# Patient Record
Sex: Female | Born: 1945
Health system: Southern US, Community
[De-identification: ages and names within clinical notes are randomized; demographics above are authoritative.]

## PROBLEM LIST (undated history)

## (undated) DIAGNOSIS — T7840XA Allergy, unspecified, initial encounter: Secondary | ICD-10-CM

## (undated) DIAGNOSIS — I8390 Asymptomatic varicose veins of unspecified lower extremity: Secondary | ICD-10-CM

## (undated) DIAGNOSIS — D649 Anemia, unspecified: Secondary | ICD-10-CM

## (undated) DIAGNOSIS — M542 Cervicalgia: Secondary | ICD-10-CM

## (undated) DIAGNOSIS — H269 Unspecified cataract: Secondary | ICD-10-CM

## (undated) DIAGNOSIS — R112 Nausea with vomiting, unspecified: Secondary | ICD-10-CM

## (undated) DIAGNOSIS — I10 Essential (primary) hypertension: Secondary | ICD-10-CM

## (undated) DIAGNOSIS — R519 Headache, unspecified: Secondary | ICD-10-CM

## (undated) DIAGNOSIS — E785 Hyperlipidemia, unspecified: Secondary | ICD-10-CM

## (undated) DIAGNOSIS — Z5189 Encounter for other specified aftercare: Secondary | ICD-10-CM

## (undated) DIAGNOSIS — R51 Headache: Secondary | ICD-10-CM

## (undated) DIAGNOSIS — J349 Unspecified disorder of nose and nasal sinuses: Secondary | ICD-10-CM

## (undated) DIAGNOSIS — Z9889 Other specified postprocedural states: Secondary | ICD-10-CM

## (undated) HISTORY — DX: Allergy, unspecified, initial encounter: T78.40XA

## (undated) HISTORY — DX: Unspecified disorder of nose and nasal sinuses: J34.9

## (undated) HISTORY — PX: COLONOSCOPY: SHX174

## (undated) HISTORY — DX: Asymptomatic varicose veins of unspecified lower extremity: I83.90

## (undated) HISTORY — DX: Hyperlipidemia, unspecified: E78.5

## (undated) HISTORY — PX: UPPER GASTROINTESTINAL ENDOSCOPY: SHX188

## (undated) HISTORY — DX: Anemia, unspecified: D64.9

## (undated) HISTORY — PX: ROTATOR CUFF REPAIR: SHX139

## (undated) HISTORY — DX: Encounter for other specified aftercare: Z51.89

## (undated) HISTORY — DX: Essential (primary) hypertension: I10

## (undated) HISTORY — PX: ABDOMINAL HYSTERECTOMY: SHX81

## (undated) HISTORY — DX: Unspecified cataract: H26.9

## (undated) HISTORY — DX: Cervicalgia: M54.2

## (undated) HISTORY — DX: Headache: R51

## (undated) HISTORY — DX: Headache, unspecified: R51.9

---

## 1998-05-02 ENCOUNTER — Encounter: Payer: Self-pay | Admitting: Internal Medicine

## 1998-05-02 ENCOUNTER — Ambulatory Visit (HOSPITAL_COMMUNITY): Admission: RE | Admit: 1998-05-02 | Discharge: 1998-05-02 | Payer: Self-pay | Admitting: Internal Medicine

## 1999-02-02 ENCOUNTER — Other Ambulatory Visit: Admission: RE | Admit: 1999-02-02 | Discharge: 1999-02-02 | Payer: Self-pay | Admitting: Internal Medicine

## 1999-03-19 HISTORY — PX: BREAST REDUCTION SURGERY: SHX8

## 1999-05-07 ENCOUNTER — Ambulatory Visit (HOSPITAL_COMMUNITY): Admission: RE | Admit: 1999-05-07 | Discharge: 1999-05-07 | Payer: Self-pay | Admitting: Internal Medicine

## 1999-05-07 ENCOUNTER — Encounter: Payer: Self-pay | Admitting: Internal Medicine

## 2000-02-11 ENCOUNTER — Encounter (INDEPENDENT_AMBULATORY_CARE_PROVIDER_SITE_OTHER): Payer: Self-pay

## 2000-02-11 ENCOUNTER — Other Ambulatory Visit: Admission: RE | Admit: 2000-02-11 | Discharge: 2000-02-11 | Payer: Self-pay | Admitting: Plastic Surgery

## 2000-02-20 ENCOUNTER — Other Ambulatory Visit: Admission: RE | Admit: 2000-02-20 | Discharge: 2000-02-20 | Payer: Self-pay | Admitting: Internal Medicine

## 2000-03-18 HISTORY — PX: REDUCTION MAMMAPLASTY: SUR839

## 2000-10-13 ENCOUNTER — Ambulatory Visit (HOSPITAL_COMMUNITY): Admission: RE | Admit: 2000-10-13 | Discharge: 2000-10-13 | Payer: Self-pay | Admitting: Internal Medicine

## 2000-10-13 ENCOUNTER — Encounter: Payer: Self-pay | Admitting: Internal Medicine

## 2001-12-04 ENCOUNTER — Ambulatory Visit (HOSPITAL_COMMUNITY): Admission: RE | Admit: 2001-12-04 | Discharge: 2001-12-04 | Payer: Self-pay | Admitting: Internal Medicine

## 2003-09-27 ENCOUNTER — Ambulatory Visit (HOSPITAL_COMMUNITY): Admission: RE | Admit: 2003-09-27 | Discharge: 2003-09-27 | Payer: Self-pay | Admitting: Internal Medicine

## 2004-09-27 ENCOUNTER — Ambulatory Visit (HOSPITAL_COMMUNITY): Admission: RE | Admit: 2004-09-27 | Discharge: 2004-09-27 | Payer: Self-pay | Admitting: Internal Medicine

## 2005-03-27 ENCOUNTER — Encounter: Admission: RE | Admit: 2005-03-27 | Discharge: 2005-03-27 | Payer: Self-pay | Admitting: Orthopaedic Surgery

## 2005-04-10 ENCOUNTER — Encounter: Admission: RE | Admit: 2005-04-10 | Discharge: 2005-04-10 | Payer: Self-pay | Admitting: Orthopaedic Surgery

## 2005-10-25 ENCOUNTER — Ambulatory Visit (HOSPITAL_COMMUNITY): Admission: RE | Admit: 2005-10-25 | Discharge: 2005-10-25 | Payer: Self-pay | Admitting: Internal Medicine

## 2005-12-18 ENCOUNTER — Encounter: Admission: RE | Admit: 2005-12-18 | Discharge: 2006-01-09 | Payer: Self-pay | Admitting: Internal Medicine

## 2006-12-02 ENCOUNTER — Encounter: Admission: RE | Admit: 2006-12-02 | Discharge: 2006-12-02 | Payer: Self-pay | Admitting: Internal Medicine

## 2007-12-08 ENCOUNTER — Encounter: Admission: RE | Admit: 2007-12-08 | Discharge: 2007-12-08 | Payer: Self-pay | Admitting: Internal Medicine

## 2007-12-30 ENCOUNTER — Ambulatory Visit: Payer: Self-pay | Admitting: Internal Medicine

## 2008-01-11 ENCOUNTER — Ambulatory Visit (HOSPITAL_COMMUNITY): Admission: RE | Admit: 2008-01-11 | Discharge: 2008-01-11 | Payer: Self-pay | Admitting: Internal Medicine

## 2008-01-11 ENCOUNTER — Ambulatory Visit: Payer: Self-pay | Admitting: Internal Medicine

## 2008-12-19 ENCOUNTER — Encounter: Admission: RE | Admit: 2008-12-19 | Discharge: 2008-12-19 | Payer: Self-pay | Admitting: Internal Medicine

## 2009-12-22 ENCOUNTER — Encounter: Admission: RE | Admit: 2009-12-22 | Discharge: 2009-12-22 | Payer: Self-pay | Admitting: Internal Medicine

## 2010-04-08 ENCOUNTER — Encounter: Payer: Self-pay | Admitting: Internal Medicine

## 2010-07-31 NOTE — Op Note (Signed)
NAMEZAKARIA, FROMER            ACCOUNT NO.:  [id number]   MEDICAL RECORD NO.:  [id number]          PATIENT TYPE:  AMB   LOCATION:  DAY                           FACILITY:  [facility]   PHYSICIAN:  R. Roetta Sessions, M.D. DATE OF BIRTH:  08-10-1945   DATE OF PROCEDURE:  01/11/2008  DATE OF DISCHARGE:                               OPERATIVE REPORT   INDICATIONS FOR PROCEDURE:  A 65 year old lady back with worsening  chronic constipation and intermittent paper hematochezia, found to  Hemoccult positive on a stool sample she returned for Dr. Jacky Kindle done  at [facility].  She is referred for colonoscopy.  She came to the on  office December 30, 2007.  Digital rectal exam demonstrated brown stool  without masses, Hemoccult positive.  We set up for a colonoscopy a day.  We talked about the risks, benefits, alternatives, and limitations of  this approach.  Her questions have been answered.  We discussed this  approach once again today. please see documentation medical record.   PROCEDURE NOTE:  O2 saturation, blood pressure, pulse of the patient  monitored throughout the entire procedure.   CONSCIOUS SEDATION:  Versed 7 mg IV Demerol 100 g IV in divided doses.   INSTRUMENT:  Pentax video chip system.   FINDINGS:  Digital rectal exam revealed no abnormalities.  Endoscopic  findings: The prep was adequate. Colon: Colonic mucosa was surveyed from  the rectosigmoid junction through the left transverse, right colon,  appendiceal orifice, ileocecal valve, and cecum. These structures were  well seen and photographed for the record.  From this level, scope was  slowly and cautiously withdrawn.  All previous mentioned mucosal  surfaces were again seen.  The patient had a somewhat of redundant  colon.  It did take some maneuvering including changing of the patient's  position, extraabdominal pressure and cecum.  The patient again was  noted to have left-sided diverticulosis; however, the remainder of  the  colonic mucosa was well seen and appeared normal otherwise, the scope  was pulled down to the rectum with examination of rectal mucosa,  including retroflexed view of the anal verge, en face view of the anal  canal demonstrated anal canal hemorrhoids only.  The patient tolerated  the procedure well and was reacted in Endoscopy.   IMPRESSION:  Anal canal hemorrhoids, otherwise normal rectum, left-sided  diverticula.  Remainder of the colonic mucosa appeared normal.   RECOMMENDATIONS:  1. Diverticulosis literature provided to Ms. Waide.  2. Hemorrhoid literature provided.  3. Daily Metamucil, Citrucel,  and Benefiber fiber supplement.  4. Ten-day course of Anusol HC suppositories one per rectum at      bedtime.  5. MiraLax 70 g orally at bedtime p.r.n. constipation.  I recommend      she come back for repeat screening colonoscopy 10 years from now.      Jonathon Bellows, M.D.  Electronically Signed     RMR/MEDQ  D:  01/11/2008  T:  01/12/2008  Job:  [id number]   cc:   Geoffry Paradise, M.D.  Fax: [phone]

## 2010-07-31 NOTE — H&P (Signed)
NAME:  Dana Mcgrath, Dana Mcgrath            ACCOUNT NO.:  [id number]   MEDICAL RECORD NO.:  [id number]           PATIENT TYPE:  AMB   LOCATION:  DAY                           FACILITY:  [facility]   PHYSICIAN:  R. Roetta Sessions, M.D. DATE OF BIRTH:  [birth year]   DATE OF ADMISSION:  DATE OF DISCHARGE:  LH                              HISTORY & PHYSICAL   REFERRING PHYSICIAN:  Geoffry Paradise, MD in [place].   REASON FOR CONSULTATION:  Hemoccult-positive stool.   HISTORY OF PRESENT ILLNESS:  Ms. Mollee Neer is a pleasant 65-year-  old Caucasian female from [place], [place], who is referred by  Dr. Jacky Kindle to further evaluate Hemoccult-positive stool.  Apparently,  during routine health maintenance, she returned a stool sample, which  was found to be Hemoccult positive.  [name] says that over the past  couple years, she has develops problems with constipation.  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  She has strain more these days to have a  bowel movement.  Occasionally, she is wiped small amount of blood after  having a bowel movement.  She denies melena.  She really never has any  diarrhea and does not have any associated abdominal pain.  Her last  colonoscopy was done here in [place] by me on December 04, 2001, she  was found have internal hemorrhoids and left-sided diverticulum, mainly  the examination was unremarkable, and she was slated to return in 2013  for routine screening.  There was no family history of any type of GI  malignancy, although her brothers had some colitis problems and had  some type of surgical procedure.  However, further information is not  known.  [name] also had problems of gastroesophageal reflux disease  in the past.  She is not currently on any acid suppressing agents, but  has watched her diet, has  lost some weight, and is doing very well on  that regard.   PAST MEDICAL HISTORY:  Significant for migraine headaches.   PAST SURGERIES:  Partial hysterectomy, she has had toe surgery, tummy  tuck procedure, breast reduction, mammoplasty previously.   CURRENT MEDICATIONS:  1. Imitrex 100 mg p.r.n.  2. Flexeril p.r.n.  3. Centrum Silver and vitamin E.  4. B12 supplement.  5. Fish oil daily.   ALLERGIES:  CODEINE and PREDNISONE.   FAMILY HISTORY:  Mother died with CVA.  Father succumbed to metastatic  prostate cancer at age 54.  One brother with colon problems as above.   SOCIAL HISTORY:  The patient is married.  She is a Scientist, physiological, lives  in [place].  No tobacco.  Rarely consumes alcohol.  No illicit drugs.   REVIEW OF SYSTEMS:  Without chest pain or dyspnea on exertion.  No  fever, chills, or night sweats.  No odynophagia, dysphagia, early  satiety, nausea, or vomiting.  She has lost 4 pounds by our scales since  2007.   PHYSICAL EXAMINATION:  GENERAL:  This is a pleasant 65 year old lady,  resting comfortably.  VITAL SIGNS:  Weight 177.5, height 5 feet 5 inches, temperature 97.8, BP  134/90, and pulse 84.  SKIN:  Warm and dry.  No jaundice or cutaneous stigmata of chronic liver  disease.  HEENT:  No scleral icterus.  CHEST:  Lungs are clear to auscultation.  HEART:  Regular rate and rhythm without murmur, gallop, or rub.  BREASTS:  Deferred.  ABDOMEN:  Nondistended.  Positive bowel sounds.  Soft, nontender.  No  appreciable mass or megaly.  EXTREMITIES: No edema.  RECTAL:  Brown stool in rectal vault.  No masses appreciated.  Brown  stool is Hemoccult positive.   IMPRESSION:  Ms. Jaionna Weisse is a pleasant 65 year old lady who has  a Hemoccult-positive stool, bowel function has deteriorated somewhat  over the past couple of years.  She has strain more and does become more  constipated.  It is good to know she had a colonoscopy back in 2003 with  results as  outlined above.  I agree with Dr. Jacky Kindle, she needs to have  further evaluation of Hemoccult-positive stool and paper hematochezia  via a colonoscopy in the very near future.  I discussed risks, benefits,  alternatives, and limitations with [name] in detail today.  Her  questions were answered.  She is agreeable.  We are going to set her up  for diagnostic colonoscopy in hospital.  I will make further  recommendations after that procedures been performed.   I would like to thank Dr. Geoffry Paradise for allow me to see this very  nice lady once again today.      Jonathon Bellows, M.D.  Electronically Signed     RMR/MEDQ  D:  12/30/2007  T:  12/31/2007  Job:  [id number]

## 2010-08-03 NOTE — Op Note (Signed)
   NAME:  Dana Mcgrath, Dana Mcgrath                      ACCOUNT NO.:  [id number]   MEDICAL RECORD NO.:  [id number]                   PATIENT TYPE:  AMB   LOCATION:  DAY                                  FACILITY:  [facility]   PHYSICIAN:  Gerrit Friends. Rourk, M.D.               DATE OF BIRTH:  1945-12-01   DATE OF PROCEDURE:  12/04/2001  DATE OF DISCHARGE:                                 OPERATIVE REPORT   PROCEDURE:  Screening colonoscopy.   ENDOSCOPIST:  Gerrit Friends. Rourk, M.D.   INDICATIONS FOR PROCEDURE:  The patient is a 65 year old lady devoid of any  lower GI tract symptoms. No family history of colorectal neoplasia who comes  for screening colonoscopy.  This approach has been discussed with the  patient.  The potential risks, benefits, and alternatives have been  reviewed, questions answered.  She is agreeable. Please see my handwritten  H&P for more information.   DESCRIPTION OF PROCEDURE:  O2 saturation, blood pressure, pulse and  respirations were monitored throughout the entire procedure.   CONSCIOUS SEDATION:  Versed 4 mg IV, Demerol 100 mg IV in divided doses.   INSTRUMENT:  Olympus video chip colonoscope.   FINDINGS:  Digital rectal exam revealed no abnormalities.   ENDOSCOPIC FINDINGS:  The prep was good.   RECTUM:  Examination of the rectal mucosa including the retroflex view of  the anal verge revealed only internal hemorrhoids.   COLON:  The colonic mucosa was surveyed from the rectosigmoid junction  through the left transverse and right colon to the area of the appendiceal  orifice, ileocecal valve, and cecum.  These structures were well seen and  photographed for the record.  The patient has left-sided diverticula and the  remainder of the colonic mucosa appeared normal.   From the level of the cecum and ileocecal valve the scope was slowly  withdrawn.  All previously mentioned mucosal surfaces were again seen; and,  again, no other abnormalities were observed.  The  patient tolerated the  procedure well and was reacted in endoscopy.   IMPRESSION:  1. Internal hemorrhoids otherwise normal rectum.  2. Left-sided diverticula, the remainder of the colonic mucosa appeared     normal.    RECOMMENDATIONS:  1. Diverticulosis literature provided to the patient.  2. Repeat colonoscopy in 10 years.                                               Gerrit Friends. Rourk, M.D.    RMR/MEDQ  D:  12/04/2001  T:  12/07/2001  Job:  [id number]

## 2011-01-17 ENCOUNTER — Ambulatory Visit
Admission: RE | Admit: 2011-01-17 | Discharge: 2011-01-17 | Disposition: A | Discharge status: Home / Self Care | Payer: Medicare Other | Source: Ambulatory Visit | Attending: Internal Medicine | Admitting: Internal Medicine

## 2011-01-17 ENCOUNTER — Other Ambulatory Visit: Payer: Self-pay | Admitting: Internal Medicine

## 2011-01-17 DIAGNOSIS — Z1231 Encounter for screening mammogram for malignant neoplasm of breast: Secondary | ICD-10-CM

## 2012-01-02 ENCOUNTER — Other Ambulatory Visit: Payer: Self-pay | Admitting: Internal Medicine

## 2012-01-02 DIAGNOSIS — Z1231 Encounter for screening mammogram for malignant neoplasm of breast: Secondary | ICD-10-CM

## 2012-02-03 ENCOUNTER — Ambulatory Visit
Admission: RE | Admit: 2012-02-03 | Discharge: 2012-02-03 | Disposition: A | Discharge status: Home / Self Care | Payer: Medicare Other | Source: Ambulatory Visit | Attending: Internal Medicine | Admitting: Internal Medicine

## 2012-02-03 DIAGNOSIS — Z1231 Encounter for screening mammogram for malignant neoplasm of breast: Secondary | ICD-10-CM

## 2013-01-25 ENCOUNTER — Other Ambulatory Visit: Payer: Self-pay

## 2013-01-25 DIAGNOSIS — Z9889 Other specified postprocedural states: Secondary | ICD-10-CM

## 2013-01-25 DIAGNOSIS — Z1231 Encounter for screening mammogram for malignant neoplasm of breast: Secondary | ICD-10-CM

## 2013-03-02 ENCOUNTER — Ambulatory Visit
Admission: RE | Admit: 2013-03-02 | Discharge: 2013-03-02 | Disposition: A | Discharge status: Home / Self Care | Payer: Medicare Other | Source: Ambulatory Visit

## 2013-03-02 DIAGNOSIS — Z1231 Encounter for screening mammogram for malignant neoplasm of breast: Secondary | ICD-10-CM

## 2013-03-02 DIAGNOSIS — Z9889 Other specified postprocedural states: Secondary | ICD-10-CM

## 2014-02-25 ENCOUNTER — Other Ambulatory Visit: Payer: Self-pay

## 2014-02-25 DIAGNOSIS — Z1231 Encounter for screening mammogram for malignant neoplasm of breast: Secondary | ICD-10-CM

## 2014-02-25 DIAGNOSIS — Z9889 Other specified postprocedural states: Secondary | ICD-10-CM

## 2014-03-02 ENCOUNTER — Ambulatory Visit
Admission: RE | Admit: 2014-03-02 | Discharge: 2014-03-02 | Disposition: A | Discharge status: Home / Self Care | Payer: Medicare Other | Source: Ambulatory Visit

## 2014-03-02 DIAGNOSIS — Z9889 Other specified postprocedural states: Secondary | ICD-10-CM

## 2014-03-02 DIAGNOSIS — Z1231 Encounter for screening mammogram for malignant neoplasm of breast: Secondary | ICD-10-CM

## 2014-10-14 ENCOUNTER — Other Ambulatory Visit: Payer: Self-pay | Admitting: *Deleted

## 2014-10-14 DIAGNOSIS — I739 Peripheral vascular disease, unspecified: Secondary | ICD-10-CM

## 2014-10-14 DIAGNOSIS — I83813 Varicose veins of bilateral lower extremities with pain: Secondary | ICD-10-CM

## 2014-11-22 ENCOUNTER — Other Ambulatory Visit: Payer: Self-pay | Admitting: Orthopaedic Surgery

## 2014-11-22 DIAGNOSIS — M542 Cervicalgia: Secondary | ICD-10-CM

## 2014-11-24 ENCOUNTER — Encounter: Payer: Self-pay | Admitting: Vascular Surgery

## 2014-11-25 ENCOUNTER — Ambulatory Visit (INDEPENDENT_AMBULATORY_CARE_PROVIDER_SITE_OTHER): Payer: Commercial Managed Care - HMO | Admitting: Vascular Surgery

## 2014-11-25 ENCOUNTER — Encounter: Payer: Self-pay | Admitting: Vascular Surgery

## 2014-11-25 ENCOUNTER — Ambulatory Visit (HOSPITAL_COMMUNITY)
Admission: RE | Admit: 2014-11-25 | Discharge: 2014-11-25 | Disposition: A | Discharge status: Home / Self Care | Payer: Commercial Managed Care - HMO | Source: Ambulatory Visit | Attending: Vascular Surgery | Admitting: Vascular Surgery

## 2014-11-25 ENCOUNTER — Ambulatory Visit (INDEPENDENT_AMBULATORY_CARE_PROVIDER_SITE_OTHER)
Admission: RE | Admit: 2014-11-25 | Discharge: 2014-11-25 | Disposition: A | Discharge status: Home / Self Care | Payer: Commercial Managed Care - HMO | Source: Ambulatory Visit | Attending: Vascular Surgery | Admitting: Vascular Surgery

## 2014-11-25 VITALS — BP 159/84 | HR 64 | Temp 98.0°F | Resp 16 | Ht 65.0 in | Wt 169.0 lb

## 2014-11-25 DIAGNOSIS — I83813 Varicose veins of bilateral lower extremities with pain: Secondary | ICD-10-CM

## 2014-11-25 DIAGNOSIS — I83893 Varicose veins of bilateral lower extremities with other complications: Secondary | ICD-10-CM | POA: Diagnosis not present

## 2014-11-25 DIAGNOSIS — I739 Peripheral vascular disease, unspecified: Secondary | ICD-10-CM | POA: Diagnosis not present

## 2014-11-25 NOTE — Progress Notes (Signed)
Referred by:  Geoffry Paradise, MD [address] [place], [place] [address]  Reason for referral: Swollen bilateral leg  History of Present Illness  Dana Mcgrath is a 69 y.o. (1945/06/09) female who presents with chief complaint: swollen leg.  Patient notes, onset of swelling several months ago, associated without obvious trigger.  The patient's symptoms include: bilateral ankle swelling as day progresses, heaviness, and burning.  The patient has had no history of DVT, known history of pregnancy, known history of varicose vein, no history of venous stasis ulcers, no history of  Lymphedema and no history of skin changes in lower legs.  There is known family history of venous disorders.  The patient has never used compression stockings in the past.  Past Medical History  Diagnosis Date  . Varicose veins     Past Surgical History  Procedure Laterality Date  . Abdominal hysterectomy      partial    Social History   Social History  . Marital Status: Married    Spouse Name: N/A  . Number of Children: N/A  . Years of Education: N/A   Occupational History  . Not on file.   Social History Main Topics  . Smoking status: Not on file  . Smokeless tobacco: Not on file  . Alcohol Use: Not on file  . Drug Use: Not on file  . Sexual Activity: Not on file   Other Topics Concern  . Not on file   Social History Narrative  . No narrative on file    Family History  Problem Relation Age of Onset  . Cancer Mother   . Diabetes Mother   . Hypertension Mother   . Cancer Father   . Hyperlipidemia Father   . Hypertension Father   . Varicose Veins Father    Current Outpatient Prescriptions  Medication Sig Dispense Refill  . Ascorbic Acid (VITAMIN C PO) Take by mouth daily.    . Cholecalciferol (VITAMIN D-3 PO) Take by mouth daily.    . Cyanocobalamin (VITAMIN B 12 PO) Take by mouth daily.    . SUMAtriptan (IMITREX) 100 MG tablet Take 100 mg by mouth every 2 (two) hours as  needed for migraine. May repeat in 2 hours if headache persists or recurs.     No current facility-administered medications for this visit.    Allergies  Allergen Reactions  . Codeine Nausea And Vomiting  . Prednisone Palpitations    REVIEW OF SYSTEMS:  (Positives checked otherwise negative)  CARDIOVASCULAR:   chest pain,  chest pressure,  palpitations,  shortness of breath when laying flat,  shortness of breath with exertion,   pain in feet when walking,  pain in feet when laying flat,  history of blood clot in veins (DVT),  history of phlebitis,  swelling in legs,  varicose veins  PULMONARY:   productive cough,  asthma,  wheezing  NEUROLOGIC:   weakness in arms or legs,  numbness in arms or legs,  difficulty speaking or slurred speech,  temporary loss of vision in one eye,  dizziness  HEMATOLOGIC:   bleeding problems,  problems with blood clotting too easily  MUSCULOSKEL:   joint pain,  joint swelling  GASTROINTEST:   vomiting blood,  blood in stool     GENITOURINARY:   burning with urination,  blood in urine  PSYCHIATRIC:   history of major depression  INTEGUMENTARY:   rashes,  ulcers  CONSTITUTIONAL:   fever,  chills   Physical Examination Filed Vitals:  11/25/14 1058 11/25/14 1101  BP: 173/87 159/84  Pulse: 99 64  Temp: 98 F (36.7 C)   Resp: 16   Height: 5\' 5"  (1.651 m)   Weight: 169 lb (76.658 kg)   SpO2: 99%    Body mass index is 28.12 kg/(m^2).  General: A&O x 3, WDWN  Head: [place]/AT  Ear/Nose/Throat: Hearing grossly intact, nares w/o erythema or drainage, oropharynx w/o Erythema/Exudate  Eyes: PERRLA, EOMI  Neck: Supple, no nuchal rigidity, no palpable LAD  Pulmonary: Sym exp, good air movt, CTAB, no rales, rhonchi, & wheezing  Cardiac: RRR, Nl S1, S2, no Murmurs, rubs or gallops  Vascular: Vessel Right Left  Radial Palpable Palpable  Brachial Palpable Palpable  Carotid  Palpable, without bruit Palpable, without bruit  Aorta Not palpable N/A  Femoral Palpable Palpable  Popliteal Not palpable Not palpable  PT Palpable Palpable  DP Palpable Palpable   Gastrointestinal: soft, NTND, -G/R, - HSM, - masses, - CVAT B  Musculoskeletal: M/S 5/5 throughout , Extremities without ischemic changes , no edema, small varicosities, extensive spider veins, no LDS  Neurologic: CN 2-12 intact , Pain and light touch intact in extremities , Motor exam as listed above  Psychiatric: Judgment intact, Mood & affect appropriate for pt's clinical situation  Dermatologic: See M/S exam for extremity exam, no rashes otherwise noted  Lymph : No Cervical, Axillary, or Inguinal lymphadenopathy    Non-Invasive Vascular Imaging  ABI (Date: 11/25/2014)  R: 1.25, DP: tri, PT: tri, TBI: 0.85  L: 1.13, DP: tri, PT: tri, TBI: 0.71   BLE Venous Insufficiency Duplex (Date: 11/25/2014):   RLE: no DVT and SVT, + GSV reflux: limited segment, + deep venous reflux: CFV, +SSV reflux  LLE: no DVT and SVT, + GSV reflux: limited segment, no deep venous reflux, +SSV reflux   Outside Studies/Documentation 3 pages of outside documents were reviewed including: outpatient PCP chart.   Medical Decision Making  Dana Mcgrath is a 69 y.o. female who presents with: BLE chronic venous insufficiency (C2), sx varicose veins, no evidence of PAD   Based on the patient's history and examination, I recommend: compressive therapy. I suspect that full therapeutic compression in this patient will have not more benefit than OTC compression given the limited CVI she has. Patient's blood pressure was found to be elevated.  Patient was referred back to their PCP for further management.  Thank you for allowing [place] to participate in this patient's care.  Leonides Sake, MD [facility] Office: [phone] Pager: [phone]  11/25/2014, 12:11 PM

## 2015-01-24 ENCOUNTER — Other Ambulatory Visit: Payer: Self-pay

## 2015-01-24 DIAGNOSIS — Z1231 Encounter for screening mammogram for malignant neoplasm of breast: Secondary | ICD-10-CM

## 2015-03-06 ENCOUNTER — Ambulatory Visit
Admission: RE | Admit: 2015-03-06 | Discharge: 2015-03-06 | Disposition: A | Discharge status: Home / Self Care | Payer: Commercial Managed Care - HMO | Source: Ambulatory Visit

## 2015-03-06 DIAGNOSIS — Z1231 Encounter for screening mammogram for malignant neoplasm of breast: Secondary | ICD-10-CM

## 2015-09-28 DIAGNOSIS — L259 Unspecified contact dermatitis, unspecified cause: Secondary | ICD-10-CM | POA: Diagnosis not present

## 2015-09-28 DIAGNOSIS — Z6828 Body mass index (BMI) 28.0-28.9, adult: Secondary | ICD-10-CM | POA: Diagnosis not present

## 2015-10-26 DIAGNOSIS — H52223 Regular astigmatism, bilateral: Secondary | ICD-10-CM | POA: Diagnosis not present

## 2016-02-12 ENCOUNTER — Other Ambulatory Visit: Payer: Self-pay | Admitting: Internal Medicine

## 2016-02-12 DIAGNOSIS — Z1231 Encounter for screening mammogram for malignant neoplasm of breast: Secondary | ICD-10-CM

## 2016-02-28 DIAGNOSIS — I10 Essential (primary) hypertension: Secondary | ICD-10-CM | POA: Diagnosis not present

## 2016-02-28 DIAGNOSIS — M859 Disorder of bone density and structure, unspecified: Secondary | ICD-10-CM | POA: Diagnosis not present

## 2016-03-06 DIAGNOSIS — N952 Postmenopausal atrophic vaginitis: Secondary | ICD-10-CM | POA: Diagnosis not present

## 2016-03-06 DIAGNOSIS — Z0000 Encounter for general adult medical examination without abnormal findings: Secondary | ICD-10-CM | POA: Diagnosis not present

## 2016-03-06 DIAGNOSIS — M542 Cervicalgia: Secondary | ICD-10-CM | POA: Diagnosis not present

## 2016-03-06 DIAGNOSIS — I10 Essential (primary) hypertension: Secondary | ICD-10-CM | POA: Diagnosis not present

## 2016-03-06 DIAGNOSIS — E784 Other hyperlipidemia: Secondary | ICD-10-CM | POA: Diagnosis not present

## 2016-03-06 DIAGNOSIS — Z1389 Encounter for screening for other disorder: Secondary | ICD-10-CM | POA: Diagnosis not present

## 2016-03-06 DIAGNOSIS — M859 Disorder of bone density and structure, unspecified: Secondary | ICD-10-CM | POA: Diagnosis not present

## 2016-03-06 DIAGNOSIS — K5790 Diverticulosis of intestine, part unspecified, without perforation or abscess without bleeding: Secondary | ICD-10-CM | POA: Diagnosis not present

## 2016-03-06 DIAGNOSIS — Z6828 Body mass index (BMI) 28.0-28.9, adult: Secondary | ICD-10-CM | POA: Diagnosis not present

## 2016-03-06 DIAGNOSIS — I7389 Other specified peripheral vascular diseases: Secondary | ICD-10-CM | POA: Diagnosis not present

## 2016-03-20 ENCOUNTER — Ambulatory Visit
Admission: RE | Admit: 2016-03-20 | Discharge: 2016-03-20 | Disposition: A | Discharge status: Home / Self Care | Payer: Medicare HMO | Source: Ambulatory Visit | Attending: Internal Medicine | Admitting: Internal Medicine

## 2016-03-20 DIAGNOSIS — Z1231 Encounter for screening mammogram for malignant neoplasm of breast: Secondary | ICD-10-CM | POA: Diagnosis not present

## 2016-04-02 ENCOUNTER — Other Ambulatory Visit: Payer: Self-pay | Admitting: Internal Medicine

## 2016-04-02 DIAGNOSIS — J0100 Acute maxillary sinusitis, unspecified: Secondary | ICD-10-CM

## 2016-04-02 DIAGNOSIS — Z6828 Body mass index (BMI) 28.0-28.9, adult: Secondary | ICD-10-CM | POA: Diagnosis not present

## 2016-04-02 DIAGNOSIS — R05 Cough: Secondary | ICD-10-CM | POA: Diagnosis not present

## 2016-04-02 DIAGNOSIS — I10 Essential (primary) hypertension: Secondary | ICD-10-CM | POA: Diagnosis not present

## 2016-04-05 ENCOUNTER — Ambulatory Visit
Admission: RE | Admit: 2016-04-05 | Discharge: 2016-04-05 | Disposition: A | Discharge status: Home / Self Care | Payer: Medicare HMO | Source: Ambulatory Visit | Attending: Internal Medicine | Admitting: Internal Medicine

## 2016-04-05 DIAGNOSIS — J0100 Acute maxillary sinusitis, unspecified: Secondary | ICD-10-CM | POA: Diagnosis not present

## 2016-04-25 DIAGNOSIS — R05 Cough: Secondary | ICD-10-CM | POA: Diagnosis not present

## 2016-04-29 DIAGNOSIS — J0130 Acute sphenoidal sinusitis, unspecified: Secondary | ICD-10-CM | POA: Diagnosis not present

## 2016-07-04 DIAGNOSIS — Z1212 Encounter for screening for malignant neoplasm of rectum: Secondary | ICD-10-CM | POA: Diagnosis not present

## 2016-08-05 DIAGNOSIS — S90851A Superficial foreign body, right foot, initial encounter: Secondary | ICD-10-CM | POA: Diagnosis not present

## 2016-08-05 DIAGNOSIS — M71571 Other bursitis, not elsewhere classified, right ankle and foot: Secondary | ICD-10-CM | POA: Diagnosis not present

## 2016-08-05 DIAGNOSIS — B070 Plantar wart: Secondary | ICD-10-CM | POA: Diagnosis not present

## 2016-08-14 DIAGNOSIS — B070 Plantar wart: Secondary | ICD-10-CM | POA: Diagnosis not present

## 2016-10-14 DIAGNOSIS — M25511 Pain in right shoulder: Secondary | ICD-10-CM | POA: Diagnosis not present

## 2016-11-25 DIAGNOSIS — Z6828 Body mass index (BMI) 28.0-28.9, adult: Secondary | ICD-10-CM | POA: Diagnosis not present

## 2016-11-25 DIAGNOSIS — R42 Dizziness and giddiness: Secondary | ICD-10-CM | POA: Diagnosis not present

## 2016-11-25 DIAGNOSIS — M5412 Radiculopathy, cervical region: Secondary | ICD-10-CM | POA: Diagnosis not present

## 2016-11-25 DIAGNOSIS — J019 Acute sinusitis, unspecified: Secondary | ICD-10-CM | POA: Diagnosis not present

## 2016-11-25 DIAGNOSIS — R51 Headache: Secondary | ICD-10-CM | POA: Diagnosis not present

## 2016-11-26 ENCOUNTER — Other Ambulatory Visit: Payer: Self-pay | Admitting: Internal Medicine

## 2016-11-26 DIAGNOSIS — M5412 Radiculopathy, cervical region: Secondary | ICD-10-CM

## 2016-11-26 DIAGNOSIS — R51 Headache: Principal | ICD-10-CM

## 2016-11-26 DIAGNOSIS — G8929 Other chronic pain: Secondary | ICD-10-CM

## 2016-11-26 DIAGNOSIS — R42 Dizziness and giddiness: Secondary | ICD-10-CM

## 2016-12-04 DIAGNOSIS — H52203 Unspecified astigmatism, bilateral: Secondary | ICD-10-CM | POA: Diagnosis not present

## 2016-12-17 ENCOUNTER — Ambulatory Visit
Admission: RE | Admit: 2016-12-17 | Discharge: 2016-12-17 | Disposition: A | Discharge status: Home / Self Care | Payer: Medicare HMO | Source: Ambulatory Visit | Attending: Internal Medicine | Admitting: Internal Medicine

## 2016-12-17 DIAGNOSIS — M4802 Spinal stenosis, cervical region: Secondary | ICD-10-CM | POA: Diagnosis not present

## 2016-12-17 DIAGNOSIS — M5412 Radiculopathy, cervical region: Secondary | ICD-10-CM

## 2016-12-17 DIAGNOSIS — R42 Dizziness and giddiness: Secondary | ICD-10-CM

## 2016-12-17 DIAGNOSIS — R51 Headache: Principal | ICD-10-CM

## 2016-12-17 DIAGNOSIS — R519 Headache, unspecified: Secondary | ICD-10-CM

## 2016-12-17 DIAGNOSIS — G8929 Other chronic pain: Secondary | ICD-10-CM

## 2017-01-09 DIAGNOSIS — M25511 Pain in right shoulder: Secondary | ICD-10-CM | POA: Diagnosis not present

## 2017-01-24 DIAGNOSIS — R69 Illness, unspecified: Secondary | ICD-10-CM | POA: Diagnosis not present

## 2017-01-24 DIAGNOSIS — M5412 Radiculopathy, cervical region: Secondary | ICD-10-CM | POA: Diagnosis not present

## 2017-03-26 DIAGNOSIS — M859 Disorder of bone density and structure, unspecified: Secondary | ICD-10-CM | POA: Diagnosis not present

## 2017-03-26 DIAGNOSIS — R82998 Other abnormal findings in urine: Secondary | ICD-10-CM | POA: Diagnosis not present

## 2017-03-26 DIAGNOSIS — I10 Essential (primary) hypertension: Secondary | ICD-10-CM | POA: Diagnosis not present

## 2017-03-26 DIAGNOSIS — E7849 Other hyperlipidemia: Secondary | ICD-10-CM | POA: Diagnosis not present

## 2017-04-02 DIAGNOSIS — R42 Dizziness and giddiness: Secondary | ICD-10-CM | POA: Diagnosis not present

## 2017-04-02 DIAGNOSIS — E7849 Other hyperlipidemia: Secondary | ICD-10-CM | POA: Diagnosis not present

## 2017-04-02 DIAGNOSIS — M542 Cervicalgia: Secondary | ICD-10-CM | POA: Diagnosis not present

## 2017-04-02 DIAGNOSIS — R51 Headache: Secondary | ICD-10-CM | POA: Diagnosis not present

## 2017-04-02 DIAGNOSIS — Z0000 Encounter for general adult medical examination without abnormal findings: Secondary | ICD-10-CM | POA: Diagnosis not present

## 2017-04-02 DIAGNOSIS — D509 Iron deficiency anemia, unspecified: Secondary | ICD-10-CM | POA: Diagnosis not present

## 2017-04-02 DIAGNOSIS — M5412 Radiculopathy, cervical region: Secondary | ICD-10-CM | POA: Diagnosis not present

## 2017-04-02 DIAGNOSIS — I739 Peripheral vascular disease, unspecified: Secondary | ICD-10-CM | POA: Diagnosis not present

## 2017-04-02 DIAGNOSIS — I10 Essential (primary) hypertension: Secondary | ICD-10-CM | POA: Diagnosis not present

## 2017-04-02 DIAGNOSIS — M859 Disorder of bone density and structure, unspecified: Secondary | ICD-10-CM | POA: Diagnosis not present

## 2017-04-02 DIAGNOSIS — N952 Postmenopausal atrophic vaginitis: Secondary | ICD-10-CM | POA: Diagnosis not present

## 2017-04-16 DIAGNOSIS — Z1212 Encounter for screening for malignant neoplasm of rectum: Secondary | ICD-10-CM | POA: Diagnosis not present

## 2017-04-16 DIAGNOSIS — Z1211 Encounter for screening for malignant neoplasm of colon: Secondary | ICD-10-CM | POA: Diagnosis not present

## 2017-04-29 DIAGNOSIS — M25511 Pain in right shoulder: Secondary | ICD-10-CM | POA: Diagnosis not present

## 2017-04-29 DIAGNOSIS — M542 Cervicalgia: Secondary | ICD-10-CM | POA: Diagnosis not present

## 2017-05-14 DIAGNOSIS — I781 Nevus, non-neoplastic: Secondary | ICD-10-CM | POA: Diagnosis not present

## 2017-05-29 ENCOUNTER — Other Ambulatory Visit: Payer: Self-pay | Admitting: Internal Medicine

## 2017-05-29 DIAGNOSIS — Z139 Encounter for screening, unspecified: Secondary | ICD-10-CM

## 2017-06-25 ENCOUNTER — Ambulatory Visit
Admission: RE | Admit: 2017-06-25 | Discharge: 2017-06-25 | Disposition: A | Discharge status: Home / Self Care | Payer: Medicare HMO | Source: Ambulatory Visit | Attending: Internal Medicine | Admitting: Internal Medicine

## 2017-06-25 DIAGNOSIS — Z139 Encounter for screening, unspecified: Secondary | ICD-10-CM

## 2017-06-25 DIAGNOSIS — Z1231 Encounter for screening mammogram for malignant neoplasm of breast: Secondary | ICD-10-CM | POA: Diagnosis not present

## 2017-07-01 DIAGNOSIS — I10 Essential (primary) hypertension: Secondary | ICD-10-CM | POA: Diagnosis not present

## 2017-07-30 DIAGNOSIS — M79642 Pain in left hand: Secondary | ICD-10-CM | POA: Diagnosis not present

## 2017-07-30 DIAGNOSIS — Z6829 Body mass index (BMI) 29.0-29.9, adult: Secondary | ICD-10-CM | POA: Diagnosis not present

## 2017-08-08 DIAGNOSIS — M25511 Pain in right shoulder: Secondary | ICD-10-CM | POA: Diagnosis not present

## 2017-08-14 DIAGNOSIS — M75121 Complete rotator cuff tear or rupture of right shoulder, not specified as traumatic: Secondary | ICD-10-CM | POA: Diagnosis not present

## 2017-08-22 DIAGNOSIS — M79645 Pain in left finger(s): Secondary | ICD-10-CM | POA: Diagnosis not present

## 2017-09-15 DIAGNOSIS — S43421A Sprain of right rotator cuff capsule, initial encounter: Secondary | ICD-10-CM | POA: Diagnosis not present

## 2017-09-15 DIAGNOSIS — S46191A Other injury of muscle, fascia and tendon of long head of biceps, right arm, initial encounter: Secondary | ICD-10-CM | POA: Diagnosis not present

## 2017-09-15 DIAGNOSIS — G8918 Other acute postprocedural pain: Secondary | ICD-10-CM | POA: Diagnosis not present

## 2017-09-15 DIAGNOSIS — Y999 Unspecified external cause status: Secondary | ICD-10-CM | POA: Diagnosis not present

## 2017-09-15 DIAGNOSIS — S46011A Strain of muscle(s) and tendon(s) of the rotator cuff of right shoulder, initial encounter: Secondary | ICD-10-CM | POA: Diagnosis not present

## 2017-09-15 DIAGNOSIS — S43431A Superior glenoid labrum lesion of right shoulder, initial encounter: Secondary | ICD-10-CM | POA: Diagnosis not present

## 2017-09-19 DIAGNOSIS — M25511 Pain in right shoulder: Secondary | ICD-10-CM | POA: Diagnosis not present

## 2017-09-24 DIAGNOSIS — M79645 Pain in left finger(s): Secondary | ICD-10-CM | POA: Diagnosis not present

## 2017-09-24 DIAGNOSIS — M13842 Other specified arthritis, left hand: Secondary | ICD-10-CM | POA: Diagnosis not present

## 2017-09-24 DIAGNOSIS — Z4789 Encounter for other orthopedic aftercare: Secondary | ICD-10-CM | POA: Diagnosis not present

## 2017-09-25 DIAGNOSIS — M25511 Pain in right shoulder: Secondary | ICD-10-CM | POA: Diagnosis not present

## 2017-09-26 DIAGNOSIS — M25511 Pain in right shoulder: Secondary | ICD-10-CM | POA: Diagnosis not present

## 2017-09-26 DIAGNOSIS — K589 Irritable bowel syndrome without diarrhea: Secondary | ICD-10-CM | POA: Diagnosis not present

## 2017-09-26 DIAGNOSIS — Z6826 Body mass index (BMI) 26.0-26.9, adult: Secondary | ICD-10-CM | POA: Diagnosis not present

## 2017-10-02 DIAGNOSIS — M25511 Pain in right shoulder: Secondary | ICD-10-CM | POA: Diagnosis not present

## 2017-10-09 DIAGNOSIS — M25511 Pain in right shoulder: Secondary | ICD-10-CM | POA: Diagnosis not present

## 2017-10-16 DIAGNOSIS — M25511 Pain in right shoulder: Secondary | ICD-10-CM | POA: Diagnosis not present

## 2017-10-21 DIAGNOSIS — M25511 Pain in right shoulder: Secondary | ICD-10-CM | POA: Diagnosis not present

## 2017-10-23 DIAGNOSIS — M25511 Pain in right shoulder: Secondary | ICD-10-CM | POA: Diagnosis not present

## 2017-10-28 DIAGNOSIS — M25511 Pain in right shoulder: Secondary | ICD-10-CM | POA: Diagnosis not present

## 2017-10-31 DIAGNOSIS — M25511 Pain in right shoulder: Secondary | ICD-10-CM | POA: Diagnosis not present

## 2017-11-11 DIAGNOSIS — M25511 Pain in right shoulder: Secondary | ICD-10-CM | POA: Diagnosis not present

## 2017-11-13 DIAGNOSIS — M25511 Pain in right shoulder: Secondary | ICD-10-CM | POA: Diagnosis not present

## 2017-11-21 DIAGNOSIS — M25511 Pain in right shoulder: Secondary | ICD-10-CM | POA: Diagnosis not present

## 2017-11-25 DIAGNOSIS — M25511 Pain in right shoulder: Secondary | ICD-10-CM | POA: Diagnosis not present

## 2017-11-27 DIAGNOSIS — M25511 Pain in right shoulder: Secondary | ICD-10-CM | POA: Diagnosis not present

## 2017-12-02 DIAGNOSIS — M25511 Pain in right shoulder: Secondary | ICD-10-CM | POA: Diagnosis not present

## 2017-12-04 DIAGNOSIS — M25511 Pain in right shoulder: Secondary | ICD-10-CM | POA: Diagnosis not present

## 2017-12-05 DIAGNOSIS — H2513 Age-related nuclear cataract, bilateral: Secondary | ICD-10-CM | POA: Diagnosis not present

## 2017-12-09 DIAGNOSIS — M25511 Pain in right shoulder: Secondary | ICD-10-CM | POA: Diagnosis not present

## 2017-12-11 DIAGNOSIS — M25511 Pain in right shoulder: Secondary | ICD-10-CM | POA: Diagnosis not present

## 2017-12-16 DIAGNOSIS — M25511 Pain in right shoulder: Secondary | ICD-10-CM | POA: Diagnosis not present

## 2017-12-25 ENCOUNTER — Encounter: Payer: Self-pay | Admitting: Internal Medicine

## 2018-01-01 DIAGNOSIS — M25511 Pain in right shoulder: Secondary | ICD-10-CM | POA: Diagnosis not present

## 2018-01-06 DIAGNOSIS — M25511 Pain in right shoulder: Secondary | ICD-10-CM | POA: Diagnosis not present

## 2018-01-07 DIAGNOSIS — I10 Essential (primary) hypertension: Secondary | ICD-10-CM | POA: Diagnosis not present

## 2018-01-07 DIAGNOSIS — Z6829 Body mass index (BMI) 29.0-29.9, adult: Secondary | ICD-10-CM | POA: Diagnosis not present

## 2018-01-07 DIAGNOSIS — Z8709 Personal history of other diseases of the respiratory system: Secondary | ICD-10-CM | POA: Diagnosis not present

## 2018-01-07 DIAGNOSIS — R0683 Snoring: Secondary | ICD-10-CM | POA: Diagnosis not present

## 2018-01-13 DIAGNOSIS — M25511 Pain in right shoulder: Secondary | ICD-10-CM | POA: Diagnosis not present

## 2018-01-14 DIAGNOSIS — Z23 Encounter for immunization: Secondary | ICD-10-CM | POA: Diagnosis not present

## 2018-01-21 DIAGNOSIS — Z9889 Other specified postprocedural states: Secondary | ICD-10-CM | POA: Diagnosis not present

## 2018-02-13 ENCOUNTER — Other Ambulatory Visit: Payer: Self-pay

## 2018-02-13 ENCOUNTER — Observation Stay (HOSPITAL_COMMUNITY)
Admission: EM | Admit: 2018-02-13 | Discharge: 2018-02-14 | Disposition: A | Discharge status: Home / Self Care | Payer: Medicare HMO | Attending: Internal Medicine | Admitting: Internal Medicine

## 2018-02-13 ENCOUNTER — Encounter (HOSPITAL_COMMUNITY): Payer: Self-pay | Admitting: *Deleted

## 2018-02-13 DIAGNOSIS — Z6829 Body mass index (BMI) 29.0-29.9, adult: Secondary | ICD-10-CM | POA: Diagnosis not present

## 2018-02-13 DIAGNOSIS — R531 Weakness: Secondary | ICD-10-CM | POA: Diagnosis not present

## 2018-02-13 DIAGNOSIS — K5900 Constipation, unspecified: Secondary | ICD-10-CM | POA: Insufficient documentation

## 2018-02-13 DIAGNOSIS — Z79899 Other long term (current) drug therapy: Secondary | ICD-10-CM | POA: Insufficient documentation

## 2018-02-13 DIAGNOSIS — Z885 Allergy status to narcotic agent status: Secondary | ICD-10-CM | POA: Diagnosis not present

## 2018-02-13 DIAGNOSIS — R000 Tachycardia, unspecified: Secondary | ICD-10-CM | POA: Diagnosis not present

## 2018-02-13 DIAGNOSIS — K254 Chronic or unspecified gastric ulcer with hemorrhage: Principal | ICD-10-CM | POA: Insufficient documentation

## 2018-02-13 DIAGNOSIS — K921 Melena: Secondary | ICD-10-CM | POA: Diagnosis present

## 2018-02-13 DIAGNOSIS — D539 Nutritional anemia, unspecified: Secondary | ICD-10-CM | POA: Diagnosis not present

## 2018-02-13 DIAGNOSIS — K922 Gastrointestinal hemorrhage, unspecified: Secondary | ICD-10-CM | POA: Diagnosis not present

## 2018-02-13 DIAGNOSIS — Z888 Allergy status to other drugs, medicaments and biological substances status: Secondary | ICD-10-CM | POA: Insufficient documentation

## 2018-02-13 DIAGNOSIS — K259 Gastric ulcer, unspecified as acute or chronic, without hemorrhage or perforation: Secondary | ICD-10-CM | POA: Diagnosis not present

## 2018-02-13 DIAGNOSIS — D649 Anemia, unspecified: Secondary | ICD-10-CM

## 2018-02-13 DIAGNOSIS — K222 Esophageal obstruction: Secondary | ICD-10-CM | POA: Insufficient documentation

## 2018-02-13 DIAGNOSIS — J3489 Other specified disorders of nose and nasal sinuses: Secondary | ICD-10-CM | POA: Diagnosis not present

## 2018-02-13 DIAGNOSIS — Z7982 Long term (current) use of aspirin: Secondary | ICD-10-CM | POA: Insufficient documentation

## 2018-02-13 DIAGNOSIS — G43909 Migraine, unspecified, not intractable, without status migrainosus: Secondary | ICD-10-CM | POA: Diagnosis not present

## 2018-02-13 DIAGNOSIS — R42 Dizziness and giddiness: Secondary | ICD-10-CM | POA: Diagnosis not present

## 2018-02-13 DIAGNOSIS — D509 Iron deficiency anemia, unspecified: Secondary | ICD-10-CM | POA: Diagnosis not present

## 2018-02-13 DIAGNOSIS — D7589 Other specified diseases of blood and blood-forming organs: Secondary | ICD-10-CM | POA: Diagnosis not present

## 2018-02-13 DIAGNOSIS — K264 Chronic or unspecified duodenal ulcer with hemorrhage: Secondary | ICD-10-CM | POA: Diagnosis not present

## 2018-02-13 DIAGNOSIS — I10 Essential (primary) hypertension: Secondary | ICD-10-CM | POA: Diagnosis not present

## 2018-02-13 DIAGNOSIS — K449 Diaphragmatic hernia without obstruction or gangrene: Secondary | ICD-10-CM | POA: Insufficient documentation

## 2018-02-13 DIAGNOSIS — K269 Duodenal ulcer, unspecified as acute or chronic, without hemorrhage or perforation: Secondary | ICD-10-CM | POA: Diagnosis not present

## 2018-02-13 HISTORY — DX: Other specified postprocedural states: Z98.890

## 2018-02-13 HISTORY — DX: Nausea with vomiting, unspecified: R11.2

## 2018-02-13 LAB — URINALYSIS, ROUTINE W REFLEX MICROSCOPIC
BILIRUBIN URINE: NEGATIVE
GLUCOSE, UA: NEGATIVE mg/dL
HGB URINE DIPSTICK: NEGATIVE
Ketones, ur: NEGATIVE mg/dL
Leukocytes, UA: NEGATIVE
Nitrite: NEGATIVE
PH: 6 (ref 5.0–8.0)
Protein, ur: NEGATIVE mg/dL
SPECIFIC GRAVITY, URINE: 1.011 (ref 1.005–1.030)

## 2018-02-13 LAB — CBC
HCT: 23.7 % — ABNORMAL LOW (ref 36.0–46.0)
HCT: 24.6 % — ABNORMAL LOW (ref 36.0–46.0)
Hemoglobin: 7.5 g/dL — ABNORMAL LOW (ref 12.0–15.0)
Hemoglobin: 7.6 g/dL — ABNORMAL LOW (ref 12.0–15.0)
MCH: 31.9 pg (ref 26.0–34.0)
MCH: 31.4 pg (ref 26.0–34.0)
MCHC: 31.6 g/dL (ref 30.0–36.0)
MCHC: 30.9 g/dL (ref 30.0–36.0)
MCV: 100.9 fL — ABNORMAL HIGH (ref 80.0–100.0)
MCV: 101.7 fL — ABNORMAL HIGH (ref 80.0–100.0)
PLATELETS: 282 10*3/uL (ref 150–400)
Platelets: 285 10*3/uL (ref 150–400)
RBC: 2.35 MIL/uL — AB (ref 3.87–5.11)
RBC: 2.42 MIL/uL — ABNORMAL LOW (ref 3.87–5.11)
RDW: 14.2 % (ref 11.5–15.5)
RDW: 14.3 % (ref 11.5–15.5)
WBC: 10.6 10*3/uL — ABNORMAL HIGH (ref 4.0–10.5)
WBC: 10.3 10*3/uL (ref 4.0–10.5)
nRBC: 0.8 % — ABNORMAL HIGH (ref 0.0–0.2)
nRBC: 0.5 % — ABNORMAL HIGH (ref 0.0–0.2)

## 2018-02-13 LAB — COMPREHENSIVE METABOLIC PANEL
ALBUMIN: 3.8 g/dL (ref 3.5–5.0)
ALK PHOS: 56 U/L (ref 38–126)
ALT: 19 U/L (ref 0–44)
ANION GAP: 7 (ref 5–15)
AST: 25 U/L (ref 15–41)
BILIRUBIN TOTAL: 0.6 mg/dL (ref 0.3–1.2)
BUN: 27 mg/dL — ABNORMAL HIGH (ref 8–23)
CALCIUM: 8.9 mg/dL (ref 8.9–10.3)
CO2: 26 mmol/L (ref 22–32)
Chloride: 106 mmol/L (ref 98–111)
Creatinine, Ser: 0.58 mg/dL (ref 0.44–1.00)
GFR calc non Af Amer: >60 mL/min (ref >60)
GLUCOSE: 114 mg/dL — AB (ref 70–99)
Potassium: 3.7 mmol/L (ref 3.5–5.1)
Sodium: 139 mmol/L (ref 135–145)
TOTAL PROTEIN: 6.2 g/dL — AB (ref 6.5–8.1)

## 2018-02-13 LAB — PREPARE RBC (CROSSMATCH)

## 2018-02-13 LAB — IRON AND TIBC
Iron: 16 ug/dL — ABNORMAL LOW (ref 28–170)
Saturation Ratios: 4 % — ABNORMAL LOW (ref 10.4–31.8)
TIBC: 397 ug/dL (ref 250–450)
UIBC: 381 ug/dL

## 2018-02-13 LAB — ABO/RH: ABO/RH(D): B POS

## 2018-02-13 LAB — FERRITIN: FERRITIN: 8 ng/mL — AB (ref 11–307)

## 2018-02-13 LAB — POC OCCULT BLOOD, ED: Fecal Occult Bld: POSITIVE — AB

## 2018-02-13 LAB — RETICULOCYTES
IMMATURE RETIC FRACT: 40 % — AB (ref 2.3–15.9)
RBC.: 2.43 MIL/uL — ABNORMAL LOW (ref 3.87–5.11)
RETIC CT PCT: 5.9 % — AB (ref 0.4–3.1)
Retic Count, Absolute: 142.6 10*3/uL (ref 19.0–186.0)

## 2018-02-13 LAB — FOLATE: Folate: 33.6 ng/mL (ref >5.9)

## 2018-02-13 LAB — VITAMIN B12: Vitamin B-12: 405 pg/mL (ref 180–914)

## 2018-02-13 MED ORDER — SODIUM CHLORIDE 0.9% IV SOLUTION: Freq: Once | INTRAVENOUS | Status: DC

## 2018-02-13 MED ORDER — PANTOPRAZOLE SODIUM 40 MG IV SOLR: 40.0000 mg | Freq: Two times a day (BID) | INTRAVENOUS | Status: DC

## 2018-02-13 MED ORDER — SENNOSIDES-DOCUSATE SODIUM 8.6-50 MG PO TABS: 1.0000 | ORAL_TABLET | Freq: Every evening | ORAL | Status: DC | PRN

## 2018-02-13 MED ORDER — ACETAMINOPHEN 650 MG RE SUPP: 650.0000 mg | Freq: Four times a day (QID) | RECTAL | Status: DC | PRN

## 2018-02-13 MED ORDER — PANTOPRAZOLE SODIUM 40 MG IV SOLR
40.0000 mg | Freq: Once | INTRAVENOUS | Status: AC
  Administered 2018-02-13: 40 mg via INTRAVENOUS
  Filled 2018-02-13: qty 40

## 2018-02-13 MED ORDER — SODIUM CHLORIDE 0.9 % IV SOLN
INTRAVENOUS | Status: DC
  Administered 2018-02-14 (×2): via INTRAVENOUS

## 2018-02-13 MED ORDER — ONDANSETRON HCL 4 MG PO TABS: 4.0000 mg | ORAL_TABLET | Freq: Four times a day (QID) | ORAL | Status: DC | PRN

## 2018-02-13 MED ORDER — ACETAMINOPHEN 325 MG PO TABS
650.0000 mg | ORAL_TABLET | Freq: Four times a day (QID) | ORAL | Status: DC | PRN
  Administered 2018-02-14: 650 mg via ORAL
  Filled 2018-02-13: qty 2

## 2018-02-13 MED ORDER — PANTOPRAZOLE SODIUM 40 MG IV SOLR
40.0000 mg | Freq: Two times a day (BID) | INTRAVENOUS | Status: DC
  Administered 2018-02-14: 40 mg via INTRAVENOUS
  Filled 2018-02-13: qty 40

## 2018-02-13 MED ORDER — ONDANSETRON HCL 4 MG/2ML IJ SOLN: 4.0000 mg | Freq: Four times a day (QID) | INTRAMUSCULAR | Status: DC | PRN

## 2018-02-13 NOTE — H&P (Signed)
[facility] History and Physical  Dana Mcgrath ZOX:[id number] DOB: [birth year] DOA: 02/13/2018  Referring physician: Alveria Apley, PA PCP: Geoffry Paradise, MD   Chief Complaint: lightheaded, black stool , low blood couns  HPI: Dana Mcgrath is a 72 y.o. female with medical history significant for history of iron deficiency anemia ( off for a year since hgb normalized) who presents on 02/13/2018 with several days of dark stool and was found to have hemoglobin of 7 at her PCP's office today.    She noticed dark stool starting 2 weeks ago. She started taking vitamins recently that she thought could be contributing. She was previously on iron but hasn't been for about a year. This morning after standing in the bathroom felt very lightheaded, dizzy, and faint.  She did not fall. She checked her blood pressure and applied a cold compress to her head for clammy sweat and stayed seated in her chair in her room. She went to her PCP for an already scheduled visit for sinus problems. She provided paperwork from [facility] off which showed her last hgb there was 14.4( 06/2017).  Her hgb on check today was 7.6. She was directed to come to the ED  She had a rotator cuff repair done July 1. For pain she took tylenol XS twice day and occasionally take a goody's powder for headaches or back pain about 2-3 times a week    ED Course: Heart rate 106, otherwise hemodynamically stable.  Fecal occult blood positive.  UA unremarkable, BUN 27.  Patient was started on IV Protonix injection.  GI was consulted by ED physician.  [facility] was called for further management   Review of Systems:  Constitutional:  No weight loss, night sweats, Fevers, chills, fatigue.  HEENT:  No headaches, Difficulty swallowing,Tooth/dental problems,Sore throat,  No sneezing, itching, ear ache, nasal congestion, post nasal drip,  Cardio-vascular:  No chest pain, Orthopnea, PND, swelling in  lower extremities, anasarca, dizziness, palpitations  GI:  No heartburn, indigestion, abdominal pain, nausea, vomiting, diarrhea, change in bowel habits, loss of appetite  Resp:  No shortness of breath with exertion or at rest. No excess mucus, no productive cough, No non-productive cough, No coughing up of blood.No change in color of mucus.No wheezing.No chest wall deformity  Skin:  no rash or lesions.  GU:  no dysuria, change in color of urine, no urgency or frequency. No flank pain.  Musculoskeletal:  No joint pain or swelling. No decreased range of motion. No back pain.  Psych:  No change in mood or affect. No depression or anxiety. No memory loss.   Past Medical History:  Diagnosis Date  . Varicose veins    Past Surgical History:  Procedure Laterality Date  . ABDOMINAL HYSTERECTOMY     partial   Social History:  reports that she has never smoked. She has never used smokeless tobacco. She reports that she drinks alcohol. She reports that she does not use drugs.  Allergies  Allergen Reactions  . Codeine Nausea And Vomiting  . Prednisone Palpitations    Family History  Problem Relation Age of Onset  . Cancer Mother   . Diabetes Mother   . Hypertension Mother   . Cancer Father   . Hyperlipidemia Father   . Hypertension Father   . Varicose Veins Father   . Breast cancer Neg Hx       Prior to Admission medications   Medication Sig Start Date End Date Taking? Authorizing Provider  Ascorbic Acid (VITAMIN C PO) Take by mouth daily.   Yes [provider]  aspirin-acetaminophen-caffeine (HEADACHE PAIN-RELIEVER) [phone] MG tablet Take 2 tablets by mouth 2 (two) times daily as needed for headache or migraine.   Yes [provider]  Biotin w/ Vitamins C & E (HAIR/SKIN/NAILS PO) Take 1 tablet by mouth daily.   Yes [provider]  Cholecalciferol (VITAMIN D-3 PO) Take by mouth daily.   Yes [provider]  co-enzyme Q-10 30 MG capsule Take  30 mg by mouth daily.   Yes [provider]  Cyanocobalamin (VITAMIN B 12 PO) Take by mouth daily.   Yes [provider]  fluticasone (FLONASE) 50 MCG/ACT nasal spray Place 2 sprays into both nostrils daily.  01/07/18  Yes [provider]   Physical Exam: Vitals:   02/13/18 1801 02/13/18 1830 02/13/18 1855 02/13/18 2054  BP: (!) 152/72 125/66 135/60 (!) 151/58  Pulse: (!) 105 88 89 94  Resp:  20 20 18   Temp: 98.2 F (36.8 C) 98.4 F (36.9 C) 97.6 F (36.4 C) 98.4 F (36.9 C)  TempSrc: Oral Oral Oral Oral  SpO2: 99% 99% 99% 100%  Weight:      Height:        Wt Readings from Last 3 Encounters:  02/13/18 77.1 kg  11/25/14 76.7 kg    Constitutional normal appearing older female, no distress Eyes: EOMI, anicteric, pale conjunctiva ENMT: Oropharynx with moist mucous membranes, normal dentition Neck: FROM,  Cardiovascular: Tachycardic  no MRGs, with no peripheral edema Respiratory: Normal respiratory effort, clear breath sounds  Abdomen: Soft,non-tender, normal bowel sounds Musculoskeletal: no clubbing / cyanosis. No joint deformity upper and lower extremities. Good ROM, no contractures. Normal muscle tone. Skin: No rash ulcers, or lesions. Without skin tenting  Neurologic: Grossly no focal neuro deficit. Psychiatric:Appropriate affect, and mood. Mental status AAOx3          Labs on Admission:  Basic Metabolic Panel: Recent Labs  Lab 02/13/18 1306  NA 139  K 3.7  CL 106  CO2 26  GLUCOSE 114*  BUN 27*  CREATININE 0.58  CALCIUM 8.9   Liver Function Tests: Recent Labs  Lab 02/13/18 1306  AST 25  ALT 19  ALKPHOS 56  BILITOT 0.6  PROT 6.2*  ALBUMIN 3.8   No results for input(s): LIPASE, AMYLASE in the last 168 hours. No results for input(s): AMMONIA in the last 168 hours. CBC: Recent Labs  Lab 02/13/18 1306 02/13/18 1814  WBC 10.6* 10.3  HGB 7.5* 7.6*  HCT 23.7* 24.6*  MCV 100.9* 101.7*  PLT 282 285   Cardiac Enzymes: No  results for input(s): CKTOTAL, CKMB, CKMBINDEX, TROPONINI in the last 168 hours.  BNP (last 3 results) No results for input(s): BNP in the last 8760 hours.  ProBNP (last 3 results) No results for input(s): PROBNP in the last 8760 hours.  CBG: No results for input(s): GLUCAP in the last 168 hours.  Radiological Exams on Admission: No results found.    Assessment/Plan Active Problems:   Symptomatic anemia   Melena   1. Symptomatic anemia.  Dizziness, lightheadedness and some overall fatigue for the past week.  Iron studies consistent with iron deficiency, most likely due to acute blood loss given reported melena.  Baseline hemoglobin of 14 now down to 7.  Receiving 2 units of blood, GI plans for EGD on 11/30 to rule out potential PUD in setting of NSAID use, n.p.o. at midnight, clears liquids for now  2. Melena.  Concern for upper GI bleed especially considering patient's frequent NSAID use.  No previous history of ulcer disease.  Continue IV Protonix 40 mg twice daily, trend CBC   Consultants: GI (called by ED physician) Code Status: Full code DVT Prophylaxis: SCDs Family Communication: Daughter updated at bedside Disposition Plan: Admitted as observation to telemetry unit, anticipate less than 2 midnight stay.  Time spent: Less than 25 minutes  Laverna PeaceShayla D Rilya Longo MD [facility]  Pager [phone]  If 7PM-7AM, please contact night-coverage [email or web address] Password [email or web address]  02/13/2018, 9:27 PM

## 2018-02-13 NOTE — H&P (View-Only) (Signed)
Referring Provider: [facility]   Primary Care Physician:  [name], [name], MD Primary Gastroenterologist:  None. Very remote colonoscopy by Dr. Kendell Baneourke     Reason for Consultation:  GI bleed    ASSESSMENT / PLAN:    1. 72 yo female with black, heme + stools and macrocytic anemia. She takes [place] powders twice a week. Hgb 7.5, baseline unknown. Suspect this is acute blood loss anemia though macrocytosis is interesting. Certainly need to rule out PUD in setting of NSAIDs -Patient will need EGD for further evaluation. The risks and benefits of EGD were discussed and the patient agrees to proceed.  -she can have clears today, NPO after MN -IV PPI -Two units of pRBCs already ordered.  -am H+H -iron studies, B12, folate pending. She takes b12 at home  2. Colon cancer screening. She reports negative Cologuard study within the last year.    HPI: Dana Mcgrath is a 72 y.o. female in relatively good health who presented to PCP's office today not feeling well. She has has been dizzy but attributed it to a sinus / ear infection. At PCP's office her hgb was 7.6. No SOB or chest pain. She has been having black stools since last week. She normally struggles with constipation but today has already had 4 BMs. She doesn't take bismuth nor iron. She does take Goody powders about twice a week. No hx of PUD. No abdominal pain. She endorses nausea but says it is a chronic issue. No hx of GERD, she doesn't take any acid blockers. She reports having had a colonoscopy > 10 years ago by Dr. Ellison Carwinouke. She reports a negative Cologuard study within the last year. Other than the black stools since last week she hasn't had any blood in stools. Her weight is stable. She mentions taking iron a year or so ago, none since then    Past Medical History:  Diagnosis Date  . Varicose veins     Past Surgical History:  Procedure Laterality Date  . ABDOMINAL HYSTERECTOMY     partial    Prior to Admission  medications   Medication Sig Start Date End Date Taking? Authorizing Provider  Ascorbic Acid (VITAMIN C PO) Take by mouth daily.   Yes [provider]  aspirin-acetaminophen-caffeine (HEADACHE PAIN-RELIEVER) [id number] MG tablet Take 2 tablets by mouth 2 (two) times daily as needed for headache or migraine.   Yes [provider]  Biotin w/ Vitamins C & E (HAIR/SKIN/NAILS PO) Take 1 tablet by mouth daily.   Yes [provider]  Cholecalciferol (VITAMIN D-3 PO) Take by mouth daily.   Yes [provider]  co-enzyme Q-10 30 MG capsule Take 30 mg by mouth daily.   Yes [provider]  Cyanocobalamin (VITAMIN B 12 PO) Take by mouth daily.   Yes [provider]  fluticasone (FLONASE) 50 MCG/ACT nasal spray Place 2 sprays into both nostrils daily.  01/07/18  Yes [provider]    Current Facility-Administered Medications  Medication Dose Route Frequency Provider Last Rate Last Dose  . 0.9 %  sodium chloride infusion (Manually program via Guardrails IV Fluids)   Intravenous Once Caccavale, Sophia, PA-C      . pantoprazole (PROTONIX) injection 40 mg  40 mg Intravenous Once Caccavale, Sophia, PA-C       Current Outpatient Medications  Medication Sig Dispense Refill  . Ascorbic Acid (VITAMIN C PO) Take by mouth daily.    Marland Kitchen. aspirin-acetaminophen-caffeine (HEADACHE PAIN-RELIEVER) 250-250-65 MG tablet Take 2  tablets by mouth 2 (two) times daily as needed for headache or migraine.    . Biotin w/ Vitamins C & E (HAIR/SKIN/NAILS PO) Take 1 tablet by mouth daily.    . Cholecalciferol (VITAMIN D-3 PO) Take by mouth daily.    Marland Kitchen co-enzyme Q-10 30 MG capsule Take 30 mg by mouth daily.    . Cyanocobalamin (VITAMIN B 12 PO) Take by mouth daily.    . fluticasone (FLONASE) 50 MCG/ACT nasal spray Place 2 sprays into both nostrils daily.   11    Allergies as of 02/13/2018 - Review Complete 02/13/2018  Allergen Reaction Noted  . Codeine Nausea And  Vomiting 11/25/2014  . Prednisone Palpitations 11/25/2014    Family History  Problem Relation Age of Onset  . Cancer Mother   . Diabetes Mother   . Hypertension Mother   . Cancer Father   . Hyperlipidemia Father   . Hypertension Father   . Varicose Veins Father   . Breast cancer Neg Hx     Social History   Socioeconomic History  . Marital status: Married    Spouse name: Not on file  . Number of children: Not on file  . Years of education: Not on file  . Highest education level: Not on file  Occupational History  . Not on file  Social Needs  . Financial resource strain: Not on file  . Food insecurity:    Worry: Not on file    Inability: Not on file  . Transportation needs:    Medical: Not on file    Non-medical: Not on file  Tobacco Use  . Smoking status: Never Smoker  . Smokeless tobacco: Never Used  Substance and Sexual Activity  . Alcohol use: Yes    Comment: occasional   . Drug use: Never  . Sexual activity: Not on file  Lifestyle  . Physical activity:    Days per week: Not on file    Minutes per session: Not on file  . Stress: Not on file  Relationships  . Social connections:    Talks on phone: Not on file    Gets together: Not on file    Attends religious service: Not on file    Active member of club or organization: Not on file    Attends meetings of clubs or organizations: Not on file    Relationship status: Not on file  . Intimate partner violence:    Fear of current or ex partner: Not on file    Emotionally abused: Not on file    Physically abused: Not on file    Forced sexual activity: Not on file  Other Topics Concern  . Not on file  Social History Narrative  . Not on file    Review of Systems: All systems reviewed and negative except where noted in HPI.  Physical Exam: Vital signs in last 24 hours: Temp:  [97.9 F (36.6 C)] 97.9 F (36.6 C) (11/29 1248) Pulse Rate:  [106] 106 (11/29 1248) Resp:  [16] 16 (11/29 1248) BP:  (172)/(70) 172/70 (11/29 1248) SpO2:  [100 %] 100 % (11/29 1248) Weight:  [77.1 kg] 77.1 kg (11/29 1249)   General:   Alert, well-developed, female in NAD Psych:  Pleasant, cooperative. Normal mood and affect. Eyes:  Pupils equal, sclera clear, no icterus.   Conjunctiva pink. Ears:  Normal auditory acuity. Nose:  No deformity, discharge,  or lesions. Neck:  Supple; no masses Lungs:  Clear throughout to auscultation.   No  wheezes, crackles, or rhonchi.  Heart:  Regular rate and rhythm; + soft murmur, no lower extremity edema Abdomen:  Soft, non-distended, nontender, BS active, no palp mass    Rectal:  Deferred  Msk:  Symmetrical without gross deformities. . Neurologic:  Alert and  oriented x4;  grossly normal neurologically. Skin:  Intact without significant lesions or rashes.   Intake/Output from previous day: No intake/output data recorded. Intake/Output this shift: No intake/output data recorded.  Lab Results: Recent Labs    02/13/18 1306  WBC 10.6*  HGB 7.5*  HCT 23.7*  PLT 282   BMET Recent Labs    02/13/18 1306  NA 139  K 3.7  CL 106  CO2 26  GLUCOSE 114*  BUN 27*  CREATININE 0.58  CALCIUM 8.9   LFT Recent Labs    02/13/18 1306  PROT 6.2*  ALBUMIN 3.8  AST 25  ALT 19  ALKPHOS 56  BILITOT 0.6      Willette Cluster, NP-C @  02/13/2018, 3:37 PM

## 2018-02-13 NOTE — ED Provider Notes (Signed)
[facility]-EMERGENCY DEPT Provider Note   CSN: [id number] Arrival date & time: 02/13/18  1236     History   Chief Complaint Chief Complaint  Patient presents with  . Abnormal Lab    HPI Dana Mcgrath is a 72 y.o. female presenting for weakness, dizziness, and low hemoglobin at her PCP.  Patient states for the past 2 weeks, she has been feeling not quite right.  She reports intermittent dizziness and lightheaded feeling, more often when she is standing.  Additionally, for the past 2 weeks she has been having sinus congestion.  Today at 3:00 she was woken from sleep, and when she went to stand up thought she was going to pass out.  Since this morning, she has had 4 dark stools, which is abnormal for her.  Patient states she feels her heartbeat is beating in her ears.  This happened before when she was anemic.  Patient states that last year she was found to be anemic, was on iron pills for several months and anemia resolved.  She has had no issues since.  Patient's last colonoscopy was 11 years ago.  Last year, instead of any colonoscopy she used Cologuard which was negative.  She denies fevers, chills, chest pain, shortness of breath, nausea, vomiting, abdominal pain, urinary symptoms.  She is not on any blood thinners.  She takes no prescription medicines daily.  At her PCPs office today, hemoglobin was found to be 7.6, and she was sent to the emergency room for further evaluation and management.  HPI  Past Medical History:  Diagnosis Date  . Varicose veins     Patient Active Problem List   Diagnosis Date Noted  . Symptomatic anemia 02/13/2018  . Varicose veins of both lower extremities with complications 11/25/2014    Past Surgical History:  Procedure Laterality Date  . ABDOMINAL HYSTERECTOMY     partial     OB History   None      Home Medications    Prior to Admission medications   Medication Sig Start Date End Date Taking? Authorizing  Provider  Ascorbic Acid (VITAMIN C PO) Take by mouth daily.   Yes [provider]  aspirin-acetaminophen-caffeine (HEADACHE PAIN-RELIEVER) [id number] MG tablet Take 2 tablets by mouth 2 (two) times daily as needed for headache or migraine.   Yes [provider]  Biotin w/ Vitamins C & E (HAIR/SKIN/NAILS PO) Take 1 tablet by mouth daily.   Yes [provider]  Cholecalciferol (VITAMIN D-3 PO) Take by mouth daily.   Yes [provider]  co-enzyme Q-10 30 MG capsule Take 30 mg by mouth daily.   Yes [provider]  Cyanocobalamin (VITAMIN B 12 PO) Take by mouth daily.   Yes [provider]  fluticasone (FLONASE) 50 MCG/ACT nasal spray Place 2 sprays into both nostrils daily.  01/07/18  Yes [provider]    Family History Family History  Problem Relation Age of Onset  . Cancer Mother   . Diabetes Mother   . Hypertension Mother   . Cancer Father   . Hyperlipidemia Father   . Hypertension Father   . Varicose Veins Father   . Breast cancer Neg Hx     Social History Social History   Tobacco Use  . Smoking status: Never Smoker  . Smokeless tobacco: Never Used  Substance Use Topics  . Alcohol use: Yes    Comment: occasional   . Drug use: Never     Allergies  Codeine and Prednisone   Review of Systems Review of Systems  Gastrointestinal: Positive for blood in stool and diarrhea.  Neurological: Positive for dizziness and weakness.  All other systems reviewed and are negative.    Physical Exam Updated Vital Signs BP (!) 172/70   Pulse (!) 106   Temp 97.9 F (36.6 C) (Oral)   Resp 16   Ht 5' 4.5" (1.638 m)   Wt 77.1 kg   SpO2 100%   BMI 28.73 kg/m   Physical Exam  Constitutional: She is oriented to person, place, and time. She appears well-developed and well-nourished. No distress.  Elderly female resting comfortably   HENT:  Head: Normocephalic and atraumatic.  MM moist  Eyes: Pupils are equal,  round, and reactive to light. Conjunctivae and EOM are normal.  Neck: Normal range of motion. Neck supple.  Cardiovascular: Regular rhythm and intact distal pulses.  Tachycardic around 110  Pulmonary/Chest: Effort normal and breath sounds normal. No respiratory distress. She has no wheezes.  Abdominal: Soft. She exhibits no distension and no mass. There is no tenderness. There is no rebound and no guarding.  No TTP of the abd. Soft without rigidity, guarding, or distention.   Genitourinary: Rectal exam shows guaiac positive stool.  Genitourinary Comments: Black stool noted on rectal exam.  Hemoccult positive.  Musculoskeletal: Normal range of motion.  Neurological: She is alert and oriented to person, place, and time.  Skin: Skin is warm and dry.  Psychiatric: She has a normal mood and affect.  Nursing note and vitals reviewed.    ED Treatments / Results  Labs (all labs ordered are listed, but only abnormal results are displayed) Labs Reviewed  COMPREHENSIVE METABOLIC PANEL - Abnormal; Notable for the following components:      Result Value   Glucose, Bld 114 (*)    BUN 27 (*)    Total Protein 6.2 (*)    All other components within normal limits  CBC - Abnormal; Notable for the following components:   WBC 10.6 (*)    RBC 2.35 (*)    Hemoglobin 7.5 (*)    HCT 23.7 (*)    MCV 100.9 (*)    nRBC 0.8 (*)    All other components within normal limits  URINALYSIS, ROUTINE W REFLEX MICROSCOPIC - Abnormal; Notable for the following components:   Color, Urine STRAW (*)    All other components within normal limits  POC OCCULT BLOOD, ED - Abnormal; Notable for the following components:   Fecal Occult Bld POSITIVE (*)    All other components within normal limits  IRON AND TIBC  RETICULOCYTES  FERRITIN  FOLATE  VITAMIN B12  TYPE AND SCREEN  PREPARE RBC (CROSSMATCH)  ABO/RH    EKG None  Radiology No results found.  Procedures .Critical Care Performed by: Alveria Apley,  PA-C Authorized by: Alveria Apley, PA-C   Critical care provider statement:    Critical care time (minutes):  45   Critical care time was exclusive of:  Separately billable procedures and treating other patients and teaching time   Critical care was necessary to treat or prevent imminent or life-threatening deterioration of the following conditions:  Circulatory failure   Critical care was time spent personally by me on the following activities:  Blood draw for specimens, development of treatment plan with patient or surrogate, discussions with consultants, evaluation of patient's response to treatment, examination of patient, obtaining history from patient or surrogate, ordering and performing treatments and interventions, ordering and review  of laboratory studies, ordering and review of radiographic studies, pulse oximetry, re-evaluation of patient's condition and review of old charts   I assumed direction of critical care for this patient from another provider in my specialty: no   Comments:     Pt with low hgb with symptoms, requiring immediate blood transfusion.    (including critical care time)  Medications Ordered in ED Medications  0.9 %  sodium chloride infusion (Manually program via Guardrails IV Fluids) (has no administration in time range)  pantoprazole (PROTONIX) injection 40 mg (has no administration in time range)     Initial Impression / Assessment and Plan / ED Course  I have reviewed the triage vital signs and the nursing notes.  Pertinent labs & imaging results that were available during my care of the patient were reviewed by me and considered in my medical decision making (see chart for details).     Patient presenting for evaluation of was dizziness, weakness, and low hemoglobin levels.  Physical exam is patient is mildly tachycardic, but otherwise appears nontoxic.  Rectal exam with concerning only dark stools.  Will obtain labs, EKG, and reassess.  Labs with  anemia at 7.5.  I cannot see previous labs for comparison, but patient states she had compete resolution of previous anemia, and PCP sent for drop in hbg, so I suspect this is new/acute.  She also has Hemoccult positive and black stools, likely GI source.  Patient remains stable, no drop in blood pressure with orthostatics, however heart rate does increase slightly.  IV Protonix started, and will start blood transfusion.  Discussed with attending, Dr. Estell Harpin evaluated the patient.  Will consult with GI.  Discussed with Dr. Jarome Matin GI, who recommends anemia panel prior to transfusion.  Recommends patient be npo, and they will see the patient in the hospital either today or tomorrow.  Recommends admission to hospitalist.  Discussed with Dr[facility] C[facility]pp [facility], patient to be admitted.  Final Clinical Impressions(s) / ED Diagnoses   Final diagnoses:  Symptomatic anemia  Gastrointestinal hemorrhage, unspecified gastrointestinal hemorrhage type    ED Discharge Orders    None       Alveria Apley, PA-C 02/13/18 1548    Bethann Berkshire, MD 02/14/18 [id number]

## 2018-02-13 NOTE — ED Notes (Signed)
ED TO INPATIENT HANDOFF REPORT  Name/Age/Gender Dana Mcgrath 72 y.o. female  Code Status Advance Directive Documentation     Most Recent Value  Type of Advance Directive  Living will  Pre-existing out of facility DNR order (yellow form or pink MOST form)  -  "MOST" Form in Place?  -      Home/SNF/Other Home  Chief Complaint blood transfuison  Level of Care/Admitting Diagnosis ED Disposition    ED Disposition Condition Comment   Admit  Hospital Area: [facility] [[id number]]  Level of Care: Telemetry [5]  Admit to tele based on following criteria: Other see comments  Comments: symptomatic anemia, tachycardia  Diagnosis: Symptomatic anemia [1914782]  Admitting Physician: Laverna Peace [[id number]]  Attending Physician: Laverna Peace [phone]  PT Class (Do Not Modify): Observation [104]  PT Acc Code (Do Not Modify): Observation [10022]       Medical History Past Medical History:  Diagnosis Date  . Varicose veins     Allergies Allergies  Allergen Reactions  . Codeine Nausea And Vomiting  . Prednisone Palpitations    IV Location/Drains/Wounds Patient Lines/Drains/Airways Status   Active Line/Drains/Airways    Name:   Placement date:   Placement time:   Site:   Days:   Peripheral IV 02/13/18 Left Antecubital   02/13/18    1307    Antecubital   less than 1   Peripheral IV 02/13/18 Right Antecubital   02/13/18    1618    Antecubital   less than 1          Labs/Imaging Results for orders placed or performed during the hospital encounter of 02/13/18 (from the past 48 hour(s))  Comprehensive metabolic panel     Status: Abnormal   Collection Time: 02/13/18  1:06 PM  Result Value Ref Range   Sodium 139 135 - 145 mmol/L   Potassium 3.7 3.5 - 5.1 mmol/L   Chloride 106 98 - 111 mmol/L   CO2 26 22 - 32 mmol/L   Glucose, Bld 114 (H) 70 - 99 mg/dL   BUN 27 (H) 8 - 23 mg/dL   Creatinine, Ser 9.62 0.44 - 1.00 mg/dL   Calcium 8.9 8.9 - 95.2  mg/dL   Total Protein 6.2 (L) 6.5 - 8.1 g/dL   Albumin 3.8 3.5 - 5.0 g/dL   AST 25 15 - 41 U/L   ALT 19 0 - 44 U/L   Alkaline Phosphatase 56 38 - 126 U/L   Total Bilirubin 0.6 0.3 - 1.2 mg/dL   GFR calc non Af Amer >60 >60 mL/min   GFR calc Af Amer >60 >60 mL/min   Anion gap 7 5 - 15    Comment: Performed at [facility], [address] [place], [place] [address]  CBC     Status: Abnormal   Collection Time: 02/13/18  1:06 PM  Result Value Ref Range   WBC 10.6 (H) 4.0 - 10.5 K/uL   RBC 2.35 (L) 3.87 - 5.11 MIL/uL   Hemoglobin 7.5 (L) 12.0 - 15.0 g/dL   HCT 44.0 (L) 10.2 - 72.5 %   MCV 100.9 (H) 80.0 - 100.0 fL   MCH 31.9 26.0 - 34.0 pg   MCHC 31.6 30.0 - 36.0 g/dL   RDW 36.6 44.0 - 34.7 %   Platelets 282 150 - 400 K/uL   nRBC 0.8 (H) 0.0 - 0.2 %    Comment: Performed at [facility], [address]  [place], [place] [address]  Type and screen [facility]     Status: None   Collection Time: 02/13/18  1:06 PM  Result Value Ref Range   ABO/RH(D) B POS    Antibody Screen NEG    Sample Expiration      02/16/2018 Performed at [facility], [address] [place], [place] [address]   ABO/Rh     Status: None   Collection Time: 02/13/18  1:06 PM  Result Value Ref Range   ABO/RH(D)      B POS Performed at [place] [facility], [address], [place], [place] [address]   Urinalysis, Routine w reflex microscopic     Status: Abnormal   Collection Time: 02/13/18  2:11 PM  Result Value Ref Range   Color, Urine STRAW (A) YELLOW   APPearance CLEAR CLEAR   Specific Gravity, Urine 1.011 1.005 - 1.030   pH 6.0 5.0 - 8.0   Glucose, UA NEGATIVE NEGATIVE mg/dL   Hgb urine dipstick NEGATIVE NEGATIVE   Bilirubin Urine NEGATIVE NEGATIVE   Ketones, ur NEGATIVE NEGATIVE mg/dL   Protein, ur NEGATIVE NEGATIVE mg/dL   Nitrite NEGATIVE NEGATIVE   Leukocytes, UA NEGATIVE NEGATIVE    Comment: Performed at  [facility], [address], [place], [place] [address]  Prepare RBC     Status: None   Collection Time: 02/13/18  2:11 PM  Result Value Ref Range   Order Confirmation      ORDER PROCESSED BY BLOOD BANK Performed at [facility], [address] [place], [place] [address]   POC occult blood, ED     Status: Abnormal   Collection Time: 02/13/18  2:18 PM  Result Value Ref Range   Fecal Occult Bld POSITIVE (A) NEGATIVE  Reticulocytes     Status: Abnormal   Collection Time: 02/13/18  4:32 PM  Result Value Ref Range   Retic Ct Pct 5.9 (H) 0.4 - 3.1 %   RBC. 2.43 (L) 3.87 - 5.11 MIL/uL   Retic Count, Absolute 142.6 19.0 - 186.0 K/uL   Immature Retic Fract 40.0 (H) 2.3 - 15.9 %    Comment: Performed at [facility], [address] [place], [place] [address]   No results found. None  Pending Labs Unresulted Labs (From admission, onward)    Start     Ordered   02/14/18 0500  CBC  Tomorrow morning,   R     02/13/18 1554   02/13/18 1455  Iron and TIBC  ONCE - STAT,   STAT     02/13/18 1454   02/13/18 1455  Ferritin (Iron Binding Protein)  ONCE - STAT,   STAT     02/13/18 1454   02/13/18 1455  Folate  ONCE - STAT,   STAT     02/13/18 1454   02/13/18 1455  Vitamin B12  ONCE - STAT,   STAT     02/13/18 1454   Signed and Held  CBC  Every 6 hours (unscheduled),   R     Signed and Held   Signed and Held  Basic metabolic panel  Tomorrow morning,   R     Signed and Held   Signed and Held  Prepare RBC  (Adult Blood Administration - Red Blood Cells)  Once,   R    Question Answer Comment  # of Units 2 units   Transfusion Indications Symptomatic Anemia   Transfusion Indications Actively Bleeding /  GI Bleed   If emergent release call blood bank Not emergent release      Signed and Held          Vitals/Pain Today's Vitals   02/13/18 1430 02/13/18 1500 02/13/18 1530 02/13/18 1645  BP: (!) 162/89 (!) 149/76 (!) 156/79 133/75   Pulse: (!) 103 94 98 99  Resp: 19 14 18 16   Temp:      TempSrc:      SpO2: 100% 100% 100% 100%  Weight:      Height:      PainSc:        Isolation Precautions No active isolations  Medications Medications  0.9 %  sodium chloride infusion (Manually program via Guardrails IV Fluids) (has no administration in time range)  pantoprazole (PROTONIX) injection 40 mg (has no administration in time range)  pantoprazole (PROTONIX) injection 40 mg (40 mg Intravenous Given 02/13/18 1648)    Mobility walks

## 2018-02-13 NOTE — Consult Note (Signed)
Referring Provider: [facility]   Primary Care Physician:  [name], [name], MD Primary Gastroenterologist:  None. Very remote colonoscopy by Dr. Kendell Baneourke     Reason for Consultation:  GI bleed    ASSESSMENT / PLAN:    1. 72 yo female with black, heme + stools and macrocytic anemia. She takes [place] powders twice a week. Hgb 7.5, baseline unknown. Suspect this is acute blood loss anemia though macrocytosis is interesting. Certainly need to rule out PUD in setting of NSAIDs -Patient will need EGD for further evaluation. The risks and benefits of EGD were discussed and the patient agrees to proceed.  -she can have clears today, NPO after MN -IV PPI -Two units of pRBCs already ordered.  -am H+H -iron studies, B12, folate pending. She takes b12 at home  2. Colon cancer screening. She reports negative Cologuard study within the last year.    HPI: Dana Mcgrath is a 72 y.o. female in relatively good health who presented to PCP's office today not feeling well. She has has been dizzy but attributed it to a sinus / ear infection. At PCP's office her hgb was 7.6. No SOB or chest pain. She has been having black stools since last week. She normally struggles with constipation but today has already had 4 BMs. She doesn't take bismuth nor iron. She does take Goody powders about twice a week. No hx of PUD. No abdominal pain. She endorses nausea but says it is a chronic issue. No hx of GERD, she doesn't take any acid blockers. She reports having had a colonoscopy > 10 years ago by Dr. Ellison Carwinouke. She reports a negative Cologuard study within the last year. Other than the black stools since last week she hasn't had any blood in stools. Her weight is stable. She mentions taking iron a year or so ago, none since then    Past Medical History:  Diagnosis Date  . Varicose veins     Past Surgical History:  Procedure Laterality Date  . ABDOMINAL HYSTERECTOMY     partial    Prior to Admission  medications   Medication Sig Start Date End Date Taking? Authorizing Provider  Ascorbic Acid (VITAMIN C PO) Take by mouth daily.   Yes [provider]  aspirin-acetaminophen-caffeine (HEADACHE PAIN-RELIEVER) [id number] MG tablet Take 2 tablets by mouth 2 (two) times daily as needed for headache or migraine.   Yes [provider]  Biotin w/ Vitamins C & E (HAIR/SKIN/NAILS PO) Take 1 tablet by mouth daily.   Yes [provider]  Cholecalciferol (VITAMIN D-3 PO) Take by mouth daily.   Yes [provider]  co-enzyme Q-10 30 MG capsule Take 30 mg by mouth daily.   Yes [provider]  Cyanocobalamin (VITAMIN B 12 PO) Take by mouth daily.   Yes [provider]  fluticasone (FLONASE) 50 MCG/ACT nasal spray Place 2 sprays into both nostrils daily.  01/07/18  Yes [provider]    Current Facility-Administered Medications  Medication Dose Route Frequency Provider Last Rate Last Dose  . 0.9 %  sodium chloride infusion (Manually program via Guardrails IV Fluids)   Intravenous Once Caccavale, Sophia, PA-C      . pantoprazole (PROTONIX) injection 40 mg  40 mg Intravenous Once Caccavale, Sophia, PA-C       Current Outpatient Medications  Medication Sig Dispense Refill  . Ascorbic Acid (VITAMIN C PO) Take by mouth daily.    Marland Kitchen. aspirin-acetaminophen-caffeine (HEADACHE PAIN-RELIEVER) 250-250-65 MG tablet Take 2  tablets by mouth 2 (two) times daily as needed for headache or migraine.    . Biotin w/ Vitamins C & E (HAIR/SKIN/NAILS PO) Take 1 tablet by mouth daily.    . Cholecalciferol (VITAMIN D-3 PO) Take by mouth daily.    Marland Kitchen co-enzyme Q-10 30 MG capsule Take 30 mg by mouth daily.    . Cyanocobalamin (VITAMIN B 12 PO) Take by mouth daily.    . fluticasone (FLONASE) 50 MCG/ACT nasal spray Place 2 sprays into both nostrils daily.   11    Allergies as of 02/13/2018 - Review Complete 02/13/2018  Allergen Reaction Noted  . Codeine Nausea And  Vomiting 11/25/2014  . Prednisone Palpitations 11/25/2014    Family History  Problem Relation Age of Onset  . Cancer Mother   . Diabetes Mother   . Hypertension Mother   . Cancer Father   . Hyperlipidemia Father   . Hypertension Father   . Varicose Veins Father   . Breast cancer Neg Hx     Social History   Socioeconomic History  . Marital status: Married    Spouse name: Not on file  . Number of children: Not on file  . Years of education: Not on file  . Highest education level: Not on file  Occupational History  . Not on file  Social Needs  . Financial resource strain: Not on file  . Food insecurity:    Worry: Not on file    Inability: Not on file  . Transportation needs:    Medical: Not on file    Non-medical: Not on file  Tobacco Use  . Smoking status: Never Smoker  . Smokeless tobacco: Never Used  Substance and Sexual Activity  . Alcohol use: Yes    Comment: occasional   . Drug use: Never  . Sexual activity: Not on file  Lifestyle  . Physical activity:    Days per week: Not on file    Minutes per session: Not on file  . Stress: Not on file  Relationships  . Social connections:    Talks on phone: Not on file    Gets together: Not on file    Attends religious service: Not on file    Active member of club or organization: Not on file    Attends meetings of clubs or organizations: Not on file    Relationship status: Not on file  . Intimate partner violence:    Fear of current or ex partner: Not on file    Emotionally abused: Not on file    Physically abused: Not on file    Forced sexual activity: Not on file  Other Topics Concern  . Not on file  Social History Narrative  . Not on file    Review of Systems: All systems reviewed and negative except where noted in HPI.  Physical Exam: Vital signs in last 24 hours: Temp:  [97.9 F (36.6 C)] 97.9 F (36.6 C) (11/29 1248) Pulse Rate:  [106] 106 (11/29 1248) Resp:  [16] 16 (11/29 1248) BP:  (172)/(70) 172/70 (11/29 1248) SpO2:  [100 %] 100 % (11/29 1248) Weight:  [77.1 kg] 77.1 kg (11/29 1249)   General:   Alert, well-developed, female in NAD Psych:  Pleasant, cooperative. Normal mood and affect. Eyes:  Pupils equal, sclera clear, no icterus.   Conjunctiva pink. Ears:  Normal auditory acuity. Nose:  No deformity, discharge,  or lesions. Neck:  Supple; no masses Lungs:  Clear throughout to auscultation.   No  wheezes, crackles, or rhonchi.  Heart:  Regular rate and rhythm; + soft murmur, no lower extremity edema Abdomen:  Soft, non-distended, nontender, BS active, no palp mass    Rectal:  Deferred  Msk:  Symmetrical without gross deformities. . Neurologic:  Alert and  oriented x4;  grossly normal neurologically. Skin:  Intact without significant lesions or rashes.   Intake/Output from previous day: No intake/output data recorded. Intake/Output this shift: No intake/output data recorded.  Lab Results: Recent Labs    02/13/18 1306  WBC 10.6*  HGB 7.5*  HCT 23.7*  PLT 282   BMET Recent Labs    02/13/18 1306  NA 139  K 3.7  CL 106  CO2 26  GLUCOSE 114*  BUN 27*  CREATININE 0.58  CALCIUM 8.9   LFT Recent Labs    02/13/18 1306  PROT 6.2*  ALBUMIN 3.8  AST 25  ALT 19  ALKPHOS 56  BILITOT 0.6      Willette Cluster, NP-C @  02/13/2018, 3:37 PM

## 2018-02-13 NOTE — ED Triage Notes (Signed)
Pt coming from her MD's office after she woke up at 3am in a sweat.  Pt reports recently sinus infection in which is has been taking flonase for.  Pt also reports recent stools were dark in color.  At her MD's office, she was found to have a hemoglobin of 7.6 and sent to ED for further evaluation. Pt a/o x 4 and ambulatory.

## 2018-02-14 ENCOUNTER — Observation Stay (HOSPITAL_COMMUNITY): Payer: Medicare HMO | Admitting: Anesthesiology

## 2018-02-14 ENCOUNTER — Encounter (HOSPITAL_COMMUNITY): Payer: Self-pay | Admitting: *Deleted

## 2018-02-14 ENCOUNTER — Encounter (HOSPITAL_COMMUNITY)
Admission: EM | Disposition: A | Discharge status: Home / Self Care | Payer: Self-pay | Source: Home / Self Care | Attending: Emergency Medicine

## 2018-02-14 DIAGNOSIS — K259 Gastric ulcer, unspecified as acute or chronic, without hemorrhage or perforation: Secondary | ICD-10-CM | POA: Diagnosis not present

## 2018-02-14 DIAGNOSIS — D509 Iron deficiency anemia, unspecified: Secondary | ICD-10-CM | POA: Diagnosis not present

## 2018-02-14 DIAGNOSIS — K269 Duodenal ulcer, unspecified as acute or chronic, without hemorrhage or perforation: Secondary | ICD-10-CM | POA: Diagnosis not present

## 2018-02-14 DIAGNOSIS — K921 Melena: Secondary | ICD-10-CM | POA: Diagnosis not present

## 2018-02-14 DIAGNOSIS — G43909 Migraine, unspecified, not intractable, without status migrainosus: Secondary | ICD-10-CM | POA: Diagnosis not present

## 2018-02-14 DIAGNOSIS — D649 Anemia, unspecified: Secondary | ICD-10-CM | POA: Diagnosis not present

## 2018-02-14 DIAGNOSIS — D7589 Other specified diseases of blood and blood-forming organs: Secondary | ICD-10-CM | POA: Diagnosis not present

## 2018-02-14 DIAGNOSIS — Z7982 Long term (current) use of aspirin: Secondary | ICD-10-CM | POA: Diagnosis not present

## 2018-02-14 DIAGNOSIS — K5900 Constipation, unspecified: Secondary | ICD-10-CM | POA: Diagnosis not present

## 2018-02-14 DIAGNOSIS — K222 Esophageal obstruction: Secondary | ICD-10-CM

## 2018-02-14 DIAGNOSIS — K922 Gastrointestinal hemorrhage, unspecified: Secondary | ICD-10-CM | POA: Diagnosis not present

## 2018-02-14 DIAGNOSIS — Z79899 Other long term (current) drug therapy: Secondary | ICD-10-CM | POA: Diagnosis not present

## 2018-02-14 DIAGNOSIS — D539 Nutritional anemia, unspecified: Secondary | ICD-10-CM | POA: Diagnosis not present

## 2018-02-14 DIAGNOSIS — K254 Chronic or unspecified gastric ulcer with hemorrhage: Secondary | ICD-10-CM | POA: Diagnosis not present

## 2018-02-14 DIAGNOSIS — K264 Chronic or unspecified duodenal ulcer with hemorrhage: Secondary | ICD-10-CM | POA: Diagnosis not present

## 2018-02-14 DIAGNOSIS — K228 Other specified diseases of esophagus: Secondary | ICD-10-CM | POA: Diagnosis not present

## 2018-02-14 DIAGNOSIS — K449 Diaphragmatic hernia without obstruction or gangrene: Secondary | ICD-10-CM | POA: Diagnosis not present

## 2018-02-14 DIAGNOSIS — K209 Esophagitis, unspecified: Secondary | ICD-10-CM | POA: Diagnosis not present

## 2018-02-14 HISTORY — PX: ESOPHAGOGASTRODUODENOSCOPY: SHX5428

## 2018-02-14 HISTORY — PX: BIOPSY: SHX5522

## 2018-02-14 LAB — CBC
HCT: 28.9 % — ABNORMAL LOW (ref 36.0–46.0)
Hemoglobin: 9.2 g/dL — ABNORMAL LOW (ref 12.0–15.0)
MCH: 28.8 pg (ref 26.0–34.0)
MCHC: 31.8 g/dL (ref 30.0–36.0)
MCV: 90.6 fL (ref 80.0–100.0)
Platelets: 221 10*3/uL (ref 150–400)
RBC: 3.19 MIL/uL — ABNORMAL LOW (ref 3.87–5.11)
RDW: 20.9 % — ABNORMAL HIGH (ref 11.5–15.5)
WBC: 10.3 10*3/uL (ref 4.0–10.5)
nRBC: 0.6 % — ABNORMAL HIGH (ref 0.0–0.2)

## 2018-02-14 LAB — BASIC METABOLIC PANEL
Anion gap: 8 (ref 5–15)
BUN: 14 mg/dL (ref 8–23)
CO2: 24 mmol/L (ref 22–32)
Calcium: 8.2 mg/dL — ABNORMAL LOW (ref 8.9–10.3)
Chloride: 109 mmol/L (ref 98–111)
Creatinine, Ser: 0.63 mg/dL (ref 0.44–1.00)
GFR calc Af Amer: >60 mL/min (ref >60)
GFR calc non Af Amer: >60 mL/min (ref >60)
Glucose, Bld: 105 mg/dL — ABNORMAL HIGH (ref 70–99)
POTASSIUM: 4 mmol/L (ref 3.5–5.1)
Sodium: 141 mmol/L (ref 135–145)

## 2018-02-14 LAB — GLUCOSE, CAPILLARY: Glucose-Capillary: 100 mg/dL — ABNORMAL HIGH (ref 70–99)

## 2018-02-14 SURGERY — EGD (ESOPHAGOGASTRODUODENOSCOPY)
Anesthesia: Monitor Anesthesia Care

## 2018-02-14 MED ORDER — PROPOFOL 10 MG/ML IV BOLUS
INTRAVENOUS | Status: AC
  Filled 2018-02-14: qty 40

## 2018-02-14 MED ORDER — ONDANSETRON HCL 4 MG/2ML IJ SOLN
INTRAMUSCULAR | Status: DC | PRN
  Administered 2018-02-14: 4 mg via INTRAVENOUS

## 2018-02-14 MED ORDER — ACETAMINOPHEN 325 MG PO TABS: 650.0000 mg | ORAL_TABLET | Freq: Four times a day (QID) | ORAL | 0 refills | Status: AC | PRN

## 2018-02-14 MED ORDER — PROPOFOL 500 MG/50ML IV EMUL
INTRAVENOUS | Status: DC | PRN
  Administered 2018-02-14: 75 ug/kg/min via INTRAVENOUS

## 2018-02-14 MED ORDER — SODIUM CHLORIDE 0.9 % IV SOLN
510.0000 mg | Freq: Once | INTRAVENOUS | Status: AC
  Administered 2018-02-14: 510 mg via INTRAVENOUS
  Filled 2018-02-14: qty 17

## 2018-02-14 MED ORDER — PROPOFOL 10 MG/ML IV BOLUS
INTRAVENOUS | Status: DC | PRN
  Administered 2018-02-14 (×2): 20 mg via INTRAVENOUS
  Administered 2018-02-14: 40 mg via INTRAVENOUS

## 2018-02-14 MED ORDER — OMEPRAZOLE 40 MG PO CPDR: 40.0000 mg | DELAYED_RELEASE_CAPSULE | Freq: Two times a day (BID) | ORAL | 1 refills | Status: DC

## 2018-02-14 MED ORDER — FERROUS SULFATE 325 (65 FE) MG PO TBEC: 325.0000 mg | DELAYED_RELEASE_TABLET | Freq: Every day | ORAL | 0 refills | Status: DC

## 2018-02-14 MED ORDER — SENNOSIDES-DOCUSATE SODIUM 8.6-50 MG PO TABS: 1.0000 | ORAL_TABLET | Freq: Every day | ORAL | 0 refills | Status: DC

## 2018-02-14 NOTE — Anesthesia Preprocedure Evaluation (Addendum)
Anesthesia Evaluation  Patient identified by MRN, date of birth, ID band Patient awake    Reviewed: Allergy & Precautions, NPO status , Patient's Chart, lab work & pertinent test results  History of Anesthesia Complications (+) PONVNegative for: history of anesthetic complications  Airway Mallampati: I  TM Distance: >3 FB Neck ROM: Full    Dental no notable dental hx.    Pulmonary neg pulmonary ROS,    Pulmonary exam normal        Cardiovascular negative cardio ROS Normal cardiovascular exam     Neuro/Psych negative neurological ROS  negative psych ROS   GI/Hepatic Neg liver ROS, PUD,   Endo/Other  negative endocrine ROS  Renal/GU negative Renal ROS  negative genitourinary   Musculoskeletal negative musculoskeletal ROS (+)   Abdominal   Peds  Hematology  (+) anemia ,   Anesthesia Other Findings 72 yo F for EGD for anemia with suspected PUD s/p 2U PRBC, Hgb now 9.2  Reproductive/Obstetrics                           Anesthesia Physical Anesthesia Plan  ASA: II  Anesthesia Plan: MAC   Post-op Pain Management:    Induction: Intravenous  PONV Risk Score and Plan: 3 and Propofol infusion, TIVA and Treatment may vary due to age or medical condition  Airway Management Planned: Nasal Cannula and Simple Face Mask  Additional Equipment: None  Intra-op Plan:   Post-operative Plan:   Informed Consent: I have reviewed the patients History and Physical, chart, labs and discussed the procedure including the risks, benefits and alternatives for the proposed anesthesia with the patient or authorized representative who has indicated his/her understanding and acceptance.     Plan Discussed with:   Anesthesia Plan Comments:        Anesthesia Quick Evaluation

## 2018-02-14 NOTE — Transfer of Care (Signed)
Immediate Anesthesia Transfer of Care Note  Patient: Dana Mcgrath  Procedure(s) Performed: Procedure(s): ESOPHAGOGASTRODUODENOSCOPY (EGD) (N/A) BIOPSY  Patient Location: PACU  Anesthesia Type:MAC  Level of Consciousness:  sedated, patient cooperative and responds to stimulation  Airway & Oxygen Therapy:Patient Spontanous Breathing and Patient connected to face mask oxgen  Post-op Assessment:  Report given to PACU RN and Post -op Vital signs reviewed and stable  Post vital signs:  Reviewed and stable  Last Vitals:  Vitals:   02/14/18 1052 02/14/18 1124  BP: (!) 160/75 (!) 111/54  Pulse: 95 92  Resp: 20 (!) 22  Temp: 37.1 C 36.8 C  SpO2: 629% 476%    Complications: No apparent anesthesia complications

## 2018-02-14 NOTE — Anesthesia Postprocedure Evaluation (Signed)
Anesthesia Post Note  Patient: Dana Mcgrath  Procedure(s) Performed: ESOPHAGOGASTRODUODENOSCOPY (EGD) (N/A ) BIOPSY     Patient location during evaluation: PACU Anesthesia Type: MAC Level of consciousness: awake and alert Pain management: pain level controlled Vital Signs Assessment: post-procedure vital signs reviewed and stable Respiratory status: spontaneous breathing, nonlabored ventilation and respiratory function stable Cardiovascular status: blood pressure returned to baseline and stable Postop Assessment: no apparent nausea or vomiting Anesthetic complications: no    Last Vitals:  Vitals:   02/14/18 1124 02/14/18 1130  BP: (!) 111/54 126/62  Pulse: 92 89  Resp: (!) 22 (!) 23  Temp: 36.8 C   SpO2: 100% 97%    Last Pain:  Vitals:   02/14/18 1130  TempSrc:   PainSc: 0-No pain                 Lidia Collum

## 2018-02-14 NOTE — Op Note (Signed)
[facility] Patient Name: Dana Mcgrath Procedure Date: 02/14/2018 MRN: [id number] Attending MD: Justice Britain , MD Date of Birth: Feb 07, 1946 CSN: [id number] Age: 72 Admit Type: Inpatient Procedure:                Upper GI endoscopy Indications:              Iron deficiency anemia due to suspected upper                            gastrointestinal bleeding, Melena Providers:                Justice Britain, MD, Elna Breslow, RN, Charolette Child, Technician, Anne Fu CRNA, CRNA Referring MD:              Medicines:                Monitored Anesthesia Care Complications:            No immediate complications. Estimated Blood Loss:     Estimated blood loss was minimal. Procedure:                Pre-Anesthesia Assessment:                           - Prior to the procedure, a History and Physical                            was performed, and patient medications and                            allergies were reviewed. The patient's tolerance of                            previous anesthesia was also reviewed. The risks                            and benefits of the procedure and the sedation                            options and risks were discussed with the patient.                            All questions were answered, and informed consent                            was obtained. Prior Anticoagulants: The patient has                            taken previous NSAID medication. ASA Grade                            Assessment: II - A patient with mild systemic  disease. After reviewing the risks and benefits,                            the patient was deemed in satisfactory condition to                            undergo the procedure.                           After obtaining informed consent, the endoscope was                            passed under direct vision. Throughout the       procedure, the patient's blood pressure, pulse, and                            oxygen saturations were monitored continuously. The                            GIF-H190 ([id number]) Olympus adult endoscope was                            introduced through the mouth, and advanced to the                            second part of duodenum. The upper GI endoscopy was                            accomplished without difficulty. The patient                            tolerated the procedure. Scope In: Scope Out: Findings:      No gross lesions were noted in the entire esophagus.      A widely patent and non-obstructing Schatzki ring was found at the       gastroesophageal junction. Biopsies were taken with a cold forceps for       histology and to disrupt the lesion.      A hiatal hernia was found. The proximal extent of the gastric folds (end       of tubular esophagus) was 35 cm from the incisors. The hiatal narrowing       was 37 cm from the incisors. The Z-line was 34 cm from the incisors.      The gastroesophageal flap valve was visualized endoscopically and       classified as Hill Grade IV (no fold, wide open lumen, hiatal hernia       present).      A few dispersed, small non-bleeding erosions were found in the cardia.       These are within the hiatal hernia and consistent with Cameron's Lesions.      Multiple dispersed small erosions were found in the gastric body and in       the gastric antrum.      Multiple (>5) non-bleeding superficial gastric ulcers with a clean ulcer       base (Forrest Class III) were found in the gastric antrum and in the  prepyloric region of the stomach. The largest lesion was 8 mm in largest       dimension.      No other gross lesions were noted in the entire examined stomach.       Biopsies were taken with a cold forceps for histology and Helicobacter       pylori testing from the antrum/incisura/greater curve/lesser curve.      Four superficial  duodenal ulcers with a clean ulcer base (Forrest Class       III) were found in the duodenal bulb, and in the D1/D2 duodenal sweep.       The largest lesion was 6 mm in largest dimension.      No gross lesions were noted in the second portion of the duodenum. Impression:               - No gross lesions in esophagus mucosa.                           - Widely patent and non-obstructing Schatzki ring.                            Biopsied to disrupt.                           - Hiatal hernia. Gastroesophageal flap valve                            classified as Hill Grade IV (no fold, wide open                            lumen, hiatal hernia present). Cameron's lesions                            present within hiatal hernia.                           - Erosive gastropathy.                           - Non-bleeding gastric ulcers with a clean ulcer                            base (Forrest Class III). Biopsied gastric lining                            to rule out HP.                           - Multiple duodenal ulcers with a clean ulcer base                            (Forrest Class III) in the bulb and the D1/D2                            angle/sweep.                           -  No gross lesions in the second portion of the                            duodenum. Moderate Sedation:      Not Applicable - Patient had care per Anesthesia. Recommendation:           - The patient will be observed post-procedure,                            until all discharge criteria are met.                           - Return patient to hospital ward for ongoing care.                           - Patient should be maintained on 40 mg BID for                            next 78-month.                           - Repeat upper endoscopy in 2-2.5 months to check                            healing.                           - Patient should follow up in clinic in 4-6 weeks                            to discuss how she is  doing, discuss if she has any                            true dysphagia symptoms, and obtain                            B12/Folate/Iron/TIBC/Ferritin/CBC (labs to be done                            1 week before).                           - The findings and recommendations were discussed                            with the patient.                           - The findings and recommendations were discussed                            with the patient's family.                           - The findings and recommendations were discussed  with the referring physician.                           - Ideally would give IV Iron prior to discharge if                            able.                           - Oral Iron once daily should be initiated.                           - Discussion about role of colonoscopy if she                            remains Iron deficienct based on labs. Procedure Code(s):        --- Professional ---                           [id number], Esophagogastroduodenoscopy, flexible,                            transoral; with biopsy, single or multiple Diagnosis Code(s):        --- Professional ---                           K22.2, Esophageal obstruction                           K44.9, Diaphragmatic hernia without obstruction or                            gangrene                           K31.89, Other diseases of stomach and duodenum                           K25.9, Gastric ulcer, unspecified as acute or                            chronic, without hemorrhage or perforation                           K26.9, Duodenal ulcer, unspecified as acute or                            chronic, without hemorrhage or perforation                           D50.9, Iron deficiency anemia, unspecified                           K92.1, Melena (includes Hematochezia) CPT copyright 2018 [employer]. All rights reserved. The codes documented in this report  are preliminary and upon coder review may  be revised to meet current compliance requirements. Valarie Merino  [name], MD 02/14/2018 11:38:24 AM Number of Addenda: 0

## 2018-02-14 NOTE — Discharge Summary (Signed)
Discharge Summary  Dana Mcgrath RUE:[id number] DOB: 1945-06-26  PCP: Geoffry Paradise, MD  Admit date: 02/13/2018 Discharge date: 02/14/2018  Time spent: , more than 50% time spent on coordination of care. Case discussed with GI  Recommendations for Outpatient Follow-up:  1. F/u with PCP within a week  for hospital discharge follow up, repeat cbc/bmp at follow up 2. F/u with GI Dr Meridee Score  Discharge Diagnoses:  Active Hospital Problems   Diagnosis Date Noted  . Symptomatic anemia 02/13/2018  . Melena 02/13/2018    Resolved Hospital Problems  No resolved problems to display.    Discharge Condition: stable  Diet recommendation: regular diet  Filed Weights   02/13/18 1249 02/14/18 0441 02/14/18 1052  Weight: 77.1 kg 77.1 kg 77 kg    History of present illness: (per admitting MD Dr Caleb Popp) PCP: Geoffry Paradise, MD   Chief Complaint: lightheaded, black stool , low blood couns  HPI: Dana Mcgrath is a 72 y.o. female with medical history significant for history of iron deficiency anemia ( off for a year since hgb normalized) who presents on 02/13/2018 with several days of dark stool and was found to have hemoglobin of 7 at her PCP's office today.    She noticed dark stool starting 2 weeks ago. She started taking vitamins recently that she thought could be contributing. She was previously on iron but hasn't been for about a year. This morning after standing in the bathroom felt very lightheaded, dizzy, and faint.  She did not fall. She checked her blood pressure and applied a cold compress to her head for clammy sweat and stayed seated in her chair in her room. She went to her PCP for an already scheduled visit for sinus problems. She provided paperwork from [facility] off which showed her last hgb there was 14.4( 06/2017).  Her hgb on check today was 7.6. She was directed to come to the ED  She had a rotator cuff repair done July 1. For  pain she took tylenol XS twice day and occasionally take a goody's powder for headaches or back pain about 2-3 times a week    ED Course: Heart rate 106, otherwise hemodynamically stable.  Fecal occult blood positive.  UA unremarkable, BUN 27.  Patient was started on IV Protonix injection.  GI was consulted by ED physician.  [facility] was called for further management   Hospital Course:  Active Problems:   Symptomatic anemia   Melena  Symptomatic anemia: S/p prbc x2 units S/p iv iron hgb improved She is discharged home on oral iron supplement  Melena: EGD on 11/30 by GI Dr Meridee Score Erosive gastropathy/nonbleeding clean based gastric ulcers/clean based duodenal ulcers Hiatal hernia with cameron's lesions nonobstructing schatzki ring On iv ppi in the hospital, she is cleared to d/c home by GI, she is discharged home on ppi bid,  She is to follow up with GI  Migraine headache: F/u with neurology Avoid NSAIDS   Procedures:  EGD  prbc transfusion  Consultations:  GI  Discharge Exam: BP 126/62   Pulse 89   Temp 98.2 F (36.8 C) (Oral)   Resp (!) 23   Ht 5\' 4"  (1.626 m)   Wt 77 kg   SpO2 97%   BMI 29.14 kg/m   General: NAD Cardiovascular: RRR Respiratory: CTABL  Discharge Instructions You were cared for by a hospitalist during your hospital stay. If you have any questions about your discharge medications or the care you received while  you were in the hospital after you are discharged, you can call the unit and asked to speak with the hospitalist on call if the hospitalist that took care of you is not available. Once you are discharged, your primary care physician will handle any further medical issues. Please note that NO REFILLS for any discharge medications will be authorized once you are discharged, as it is imperative that you return to your primary care physician (or establish a relationship with a primary care physician if you do not have one)  for your aftercare needs so that they can reassess your need for medications and monitor your lab values.  Discharge Instructions    Diet general   Complete by:  As directed    Increase activity slowly   Complete by:  As directed      Allergies as of 02/14/2018      Reactions   Codeine Nausea And Vomiting   Prednisone Palpitations      Medication List    STOP taking these medications   HEADACHE PAIN-RELIEVER 250-250-65 MG tablet Generic drug:  aspirin-acetaminophen-caffeine     TAKE these medications   acetaminophen 325 MG tablet Commonly known as:  TYLENOL Take 2 tablets (650 mg total) by mouth every 6 (six) hours as needed for mild pain (or Fever >/= 101).   co-enzyme Q-10 30 MG capsule Take 30 mg by mouth daily.   ferrous sulfate 325 (65 FE) MG EC tablet Take 1 tablet (325 mg total) by mouth daily with breakfast.   fluticasone 50 MCG/ACT nasal spray Commonly known as:  FLONASE Place 2 sprays into both nostrils daily.   HAIR/SKIN/NAILS PO Take 1 tablet by mouth daily.   omeprazole 40 MG capsule Commonly known as:  PRILOSEC Take 1 capsule (40 mg total) by mouth 2 (two) times daily.   senna-docusate 8.6-50 MG tablet Commonly known as:  Senokot-S Take 1 tablet by mouth at bedtime.   VITAMIN B 12 PO Take by mouth daily.   VITAMIN C PO Take by mouth daily.   VITAMIN D-3 PO Take by mouth daily.      Allergies  Allergen Reactions  . Codeine Nausea And Vomiting  . Prednisone Palpitations   Follow-up Information    [name], [name], MD Follow up in 1 week(s).   Specialty:  Internal Medicine Why:  hospital discharge follow up, repeat cbc/bmp at follow up Contact information: [address] [place] [address] [phone]        [name], [name], MD Follow up in 1 month(s).   Specialties:  Gastroenterology, Internal Medicine Contact information: [address] [place] [address] [phone]            The results of  significant diagnostics from this hospitalization (including imaging, microbiology, ancillary and laboratory) are listed below for reference.    Significant Diagnostic Studies: No results found.  Microbiology: No results found for this or any previous visit (from the past 240 hour(s)).   Labs: Basic Metabolic Panel: Recent Labs  Lab 02/13/18 1306 02/14/18 0550  NA 139 141  K 3.7 4.0  CL 106 109  CO2 26 24  GLUCOSE 114* 105*  BUN 27* 14  CREATININE 0.58 0.63  CALCIUM 8.9 8.2*   Liver Function Tests: Recent Labs  Lab 02/13/18 1306  AST 25  ALT 19  ALKPHOS 56  BILITOT 0.6  PROT 6.2*  ALBUMIN 3.8   No results for input(s): LIPASE, AMYLASE in the last 168 hours. No results for input(s):  AMMONIA in the last 168 hours. CBC: Recent Labs  Lab 02/13/18 1306 02/13/18 1814 02/14/18 0550  WBC 10.6* 10.3 10.3  HGB 7.5* 7.6* 9.2*  HCT 23.7* 24.6* 28.9*  MCV 100.9* 101.7* 90.6  PLT 282 285 221   Cardiac Enzymes: No results for input(s): CKTOTAL, CKMB, CKMBINDEX, TROPONINI in the last 168 hours. BNP: BNP (last 3 results) No results for input(s): BNP in the last 8760 hours.  ProBNP (last 3 results) No results for input(s): PROBNP in the last 8760 hours.  CBG: Recent Labs  Lab 02/14/18 0734  GLUCAP 100*       Signed:  Albertine Grates MD, PhD  [facility] 02/14/2018, 12:40 PM

## 2018-02-14 NOTE — Interval H&P Note (Signed)
History and Physical Interval Note:  02/14/2018 10:42 AM  Dana Mcgrath  has presented today for surgery, with the diagnosis of melena, anemia  The various methods of treatment have been discussed with the patient and family. After consideration of risks, benefits and other options for treatment, the patient has consented to  Procedure(s): ESOPHAGOGASTRODUODENOSCOPY (EGD) (N/A) as a surgical intervention .  The patient's history has been reviewed, patient examined, no change in status, stable for surgery.  I have reviewed the patient's chart and labs.  Questions were answered to the patient's satisfaction.    The risks and benefits of endoscopic evaluation were discussed with the patient; these include but are not limited to the risk of perforation, infection, bleeding, missed lesions, lack of diagnosis, severe illness requiring hospitalization, as well as anesthesia and sedation related illnesses.  The patient is agreeable to proceed.    Gannett Coabriel [name] Jr

## 2018-02-16 ENCOUNTER — Encounter (HOSPITAL_COMMUNITY): Payer: Self-pay | Admitting: Gastroenterology

## 2018-02-16 LAB — TYPE AND SCREEN
ABO/RH(D): B POS
Antibody Screen: NEGATIVE
UNIT DIVISION: 0
Unit division: 0

## 2018-02-16 LAB — BPAM RBC
Blood Product Expiration Date: 201912122359
Blood Product Expiration Date: 201912122359
ISSUE DATE / TIME: 201911291835
ISSUE DATE / TIME: 201911292128
Unit Type and Rh: 7300
Unit Type and Rh: 7300

## 2018-02-19 DIAGNOSIS — D649 Anemia, unspecified: Secondary | ICD-10-CM | POA: Diagnosis not present

## 2018-02-19 DIAGNOSIS — Z6829 Body mass index (BMI) 29.0-29.9, adult: Secondary | ICD-10-CM | POA: Diagnosis not present

## 2018-02-19 DIAGNOSIS — K921 Melena: Secondary | ICD-10-CM | POA: Diagnosis not present

## 2018-02-19 DIAGNOSIS — R51 Headache: Secondary | ICD-10-CM | POA: Diagnosis not present

## 2018-02-20 ENCOUNTER — Encounter: Payer: Self-pay | Admitting: Gastroenterology

## 2018-02-20 ENCOUNTER — Other Ambulatory Visit: Payer: Self-pay

## 2018-02-20 DIAGNOSIS — D649 Anemia, unspecified: Secondary | ICD-10-CM

## 2018-02-25 ENCOUNTER — Telehealth: Payer: Self-pay | Admitting: Neurology

## 2018-02-25 ENCOUNTER — Encounter: Payer: Self-pay | Admitting: Neurology

## 2018-02-25 NOTE — Telephone Encounter (Signed)
Pt is coming in to see Dr. Vickey Hugerohmeier for a sleep consult on 12/12, but we've received a new referral from Dr. Jacky KindleAronson for chronic headache. Pt would like to see Dr. Lucia GaskinsAhern for this. Would you both be ok with this?

## 2018-02-25 NOTE — Telephone Encounter (Signed)
I would have Dr. Vickey Hugerohmeier address her migraines since she is being seen already by her. If Dr. Vickey Hugerohmeier thinks she needs to see me then she can refer to me thanks.

## 2018-02-26 ENCOUNTER — Encounter: Payer: Self-pay | Admitting: Neurology

## 2018-02-26 ENCOUNTER — Encounter

## 2018-02-26 ENCOUNTER — Ambulatory Visit (INDEPENDENT_AMBULATORY_CARE_PROVIDER_SITE_OTHER): Payer: Medicare HMO | Admitting: Neurology

## 2018-02-26 VITALS — BP 147/82 | HR 81 | Ht 64.5 in | Wt 168.0 lb

## 2018-02-26 DIAGNOSIS — J323 Chronic sphenoidal sinusitis: Secondary | ICD-10-CM

## 2018-02-26 DIAGNOSIS — G4440 Drug-induced headache, not elsewhere classified, not intractable: Secondary | ICD-10-CM | POA: Diagnosis not present

## 2018-02-26 DIAGNOSIS — T3995XA Adverse effect of unspecified nonopioid analgesic, antipyretic and antirheumatic, initial encounter: Secondary | ICD-10-CM

## 2018-02-26 DIAGNOSIS — G4709 Other insomnia: Secondary | ICD-10-CM

## 2018-02-26 DIAGNOSIS — J322 Chronic ethmoidal sinusitis: Secondary | ICD-10-CM

## 2018-02-26 DIAGNOSIS — Z9189 Other specified personal risk factors, not elsewhere classified: Secondary | ICD-10-CM

## 2018-02-26 DIAGNOSIS — R0683 Snoring: Secondary | ICD-10-CM

## 2018-02-26 NOTE — Progress Notes (Signed)
SLEEP MEDICINE CLINIC   Provider:  Melvyn Novas, M D  Primary Care Physician:  Geoffry Paradise, MD   Referring Provider: Geoffry Paradise, MD   Chief Complaint  Patient presents with  . New Patient (Initial Visit)    RM 10, with husband. Has never had a sleep study but does endorse snoring. she is wondering if this is sinus related. pt is also complaining of headaches and neck pain.    HPI:  Dana Mcgrath is a 72 y.o. female patient here with her husband , who is a patient of Dr. Lucia Gaskins), seen here as in a referral from Dr. Jacky Kindle for a sleep evaluation.   The patient reports she is in need of a headache specialist, has sinus problems, ulcers for taking too much medications, previously see by Dr. Neale Burly. She has taken a lot of goody powders, Excedrin Migraine, etc..  Dana Mcgrath reports that she had on and off problems with sinus congestion and nasal congestion for years, and that has kept her recently more often from breathing easily at night.  She feels she cannot breathe and she wakes up from it, her husband has also heard her snore and has noticed the intermittent pauses in her breathing. Crescendo snoring.  It is interesting that Dana Mcgrath attributes most of her problems and sleep and headaches to the sinus congestion but has never been seen by an ENT specialist so far.  Sleep habits are as follows: Dinner time is 5-6 PM, and bedtime is as late as 10.30 PM. No TV in the bedroom, cool ,a quiet and dark environment.  If she uses a nose spray and Tylenol Sinus before bedtime, she can sleep for 4 hours, than she wakes up and is congested again. She feels a neck stiffness and fluid pressure in her nostril, sinus .  She sleeps less well during weather changes, and she has neck pain.  She has 2-3 nocturia.  She will take a second tylenol pill and goes back to sleep - certainly over-user.  total nocturnal sleep time, 6-7 hours.    Sleep medical history and family sleep  history: years of sinus headaches and medication overuse. Snoring for several month. Father had HA. Eldest brother has sinus HA.   Social history: married, retired,  Non smoker, non ETOH- caffeine - only in  Excedrine or similar. One daughter, 75.  One grandson 85. 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   Review of Systems: Out of a complete 14 system review, the patient complains of only the following symptoms, and all other reviewed systems are negative. Uses  imitrex, generic, but has a limited supply.  HA pain relieve OTC. Goody powders.   Epworth score  2 , Fatigue severity score 9  , depression score 0/ 15    Social History   Socioeconomic History  . Marital status: Married    Spouse name: Not on file  . Number of children: Not on file  . Years of education: Not on file  . Highest education level: Not on file  Occupational History  . Not on file  Social Needs  . Financial resource strain: Not on file  . Food insecurity:    Worry: Not on file    Inability: Not on file  . Transportation needs:    Medical: Not on file    Non-medical: Not on file  Tobacco Use  . Smoking status: Never Smoker  . Smokeless tobacco: Never Used  Substance and Sexual Activity  . Alcohol  use: Yes    Comment: occasional   . Drug use: Never  . Sexual activity: Not on file  Lifestyle  . Physical activity:    Days per week: Not on file    Minutes per session: Not on file  . Stress: Not on file  Relationships  . Social connections:    Talks on phone: Not on file    Gets together: Not on file    Attends religious service: Not on file    Active member of club or organization: Not on file    Attends meetings of clubs or organizations: Not on file    Relationship status: Not on file  . Intimate partner violence:    Fear of current or ex partner: Not on file    Emotionally abused: Not on file    Physically abused: Not on file    Forced sexual activity: Not on file  Other Topics Concern  . Not  on file  Social History Narrative  . Not on file    Family History  Problem Relation Age of Onset  . Cancer Mother   . Diabetes Mother   . Hypertension Mother   . Cancer Father   . Hyperlipidemia Father   . Hypertension Father   . Varicose Veins Father   . Breast cancer Neg Hx     Past Medical History:  Diagnosis Date  . Cervical pain (neck)   . Headache   . HLD (hyperlipidemia)   . Hypertension   . PONV (postoperative nausea and vomiting)   . Sinus disease   . Varicose veins     Past Surgical History:  Procedure Laterality Date  . ABDOMINAL HYSTERECTOMY     partial  . BIOPSY  02/14/2018   Procedure: BIOPSY;  Surgeon: Meridee Score Netty Starring., MD;  Location: [facility];  Service: Gastroenterology;;  . BREAST REDUCTION SURGERY  2001  . ESOPHAGOGASTRODUODENOSCOPY N/A 02/14/2018   Procedure: ESOPHAGOGASTRODUODENOSCOPY (EGD);  Surgeon: Lemar Lofty., MD;  Location: [facility];  Service: Gastroenterology;  Laterality: N/A;  . ROTATOR CUFF REPAIR Right     Current Outpatient Medications  Medication Sig Dispense Refill  . acetaminophen (TYLENOL) 325 MG tablet Take 2 tablets (650 mg total) by mouth every 6 (six) hours as needed for mild pain (or Fever >/= 101). 30 tablet 0  . Ascorbic Acid (VITAMIN C PO) Take by mouth daily.    . Biotin w/ Vitamins C & E (HAIR/SKIN/NAILS PO) Take 1 tablet by mouth daily.    . Cholecalciferol (VITAMIN D-3 PO) Take by mouth daily.    Marland Kitchen co-enzyme Q-10 30 MG capsule Take 30 mg by mouth daily.    . Cyanocobalamin (VITAMIN B 12 PO) Take by mouth daily.    . ferrous sulfate 325 (65 FE) MG EC tablet Take 1 tablet (325 mg total) by mouth daily with breakfast. 30 tablet 0  . fluticasone (FLONASE) 50 MCG/ACT nasal spray Place 2 sprays into both nostrils daily.   11  . omeprazole (PRILOSEC) 40 MG capsule Take 1 capsule (40 mg total) by mouth 2 (two) times daily. 60 capsule 1  . senna-docusate (SENOKOT-S) 8.6-50 MG tablet Take 1 tablet by  mouth at bedtime. 30 tablet 0   No current facility-administered medications for this visit.     Allergies as of 02/26/2018 - Review Complete 02/26/2018  Allergen Reaction Noted  . Codeine Nausea And Vomiting 11/25/2014  . Prednisone Palpitations 11/25/2014    Vitals: BP (!) 147/82   Pulse 81  Ht 5' 4.5" (1.638 m)   Wt 168 lb (76.2 kg)   BMI 28.39 kg/m  Last Weight:  Wt Readings from Last 1 Encounters:  02/26/18 168 lb (76.2 kg)   ONG:EXBM mass index is 28.39 kg/m.     Last Height:   Ht Readings from Last 1 Encounters:  02/26/18 5' 4.5" (1.638 m)    Physical exam:  General: The patient is awake, alert and appears not in acute distress. The patient is well groomed. Head: Normocephalic, atraumatic. Neck is supple. Mallampati  2- but short.  neck circumference:14.5 . Nasal airflow congested right nasion most congested. , TMJ is   evident . Retrognathia is seen. Bruxism.  Cardiovascular:  Regular rate and rhythm , without  murmurs or carotid bruit, and without distended neck veins. Respiratory: Lungs are clear to auscultation. Skin:  Without evidence of edema, or rash Trunk: BMI is 28.3   Neurologic exam : The patient is awake and alert, oriented to place and time.    Attention span & concentration ability appears normal.  Speech is fluent,  without dysarthria, dysphonia or aphasia.  Mood and affect are appropriate.  Cranial nerves: Pupils are equal and briskly reactive to light. Funduscopic exam without  evidence of pallor or edema. Extraocular movements  in vertical and horizontal planes intact and without nystagmus. Visual fields by finger perimetry are intact. Hearing to finger rub intact.  Facial sensation intact to fine touch. She reports sinus pressure points under the eye and sphenoidal.   Facial motor strength is symmetric and tongue and uvula move midline. Shoulder shrug was symmetrical.   Motor exam: Normal tone, muscle bulk and symmetric strength in all  extremities.  Sensory:  Fine touch, pinprick and vibration were tested in all extremities. Proprioception tested in the upper extremities was normal. Coordination: Rapid alternating movements in the fingers/hands was normal. Finger-to-nose maneuver  normal without evidence of ataxia, dysmetria or tremor. Gait and station: Patient walks without assistive device and is able unassisted to climb up to the exam table. Strength within normal limits. Stance is stable and normal.   Deep tendon reflexes: in the  upper and lower extremities are symmetric and intact.   Assessment:  After physical and neurologic examination, review of laboratory studies,  Personal review of imaging studies, reports of other /same  Imaging studies, results of polysomnography and / or neurophysiology testing and pre-existing records as far as provided in visit., my assessment is:    1) patient reported to snore and have pauses in breathing. Her apnea can can certainly be exacerbated by the lack of nasal airflow patency, and that her breathing has been more disturbed over the last 6 months also may have been a result of an almost chronic sinusitis.  She also has noticed that her headaches are relieved within a short time but only for a short duration with her over-the-counter medications.  There is a risk of overusing these over-the-counter medications and they may contribute to ulcers, high blood pressure, and a special type of headaches related to the overuse that is usually more of a generalized pressure sensation within the head, stiffness of the neck and a feeling of pressure behind the eye.     The patient was advised of the nature of the diagnosed disorder , the treatment options and the  risks for general health and wellness arising from not treating the condition.   I spent more than 45 minutes of face to face time with the patient.  Greater  than 50% of time was spent in counseling and coordination of care. We have  discussed the diagnosis and differential and I answered the patient's questions.    Plan:  Treatment plan and additional workup :  I think we need for Dana Mcgrath to undergo a sleep study and to see if she has apnea or not, if they find apnea and it is not associated with low oxygen levels I would consider it not a contributor to her headaches.  However this form of apnea is usually well treated with a dental device and she would be able to address this in 1 session with her dentist who is also interested in treating her bruxism.  I will order a sleep study for her it will probably be done in the early months of the new year 2020.   Melvyn NovasARMEN Abhiraj Dozal, MD 02/26/2018, 12:03 PM  Certified in Neurology by ABPN Certified in Sleep Medicine by [facility]  [facility] [address], [address] [place], [place] [address]

## 2018-03-03 DIAGNOSIS — T3995XA Adverse effect of unspecified nonopioid analgesic, antipyretic and antirheumatic, initial encounter: Secondary | ICD-10-CM | POA: Diagnosis not present

## 2018-03-03 DIAGNOSIS — J322 Chronic ethmoidal sinusitis: Secondary | ICD-10-CM | POA: Diagnosis not present

## 2018-03-03 DIAGNOSIS — J3489 Other specified disorders of nose and nasal sinuses: Secondary | ICD-10-CM | POA: Diagnosis not present

## 2018-03-03 DIAGNOSIS — G4440 Drug-induced headache, not elsewhere classified, not intractable: Secondary | ICD-10-CM | POA: Diagnosis not present

## 2018-03-03 DIAGNOSIS — Z9189 Other specified personal risk factors, not elsewhere classified: Secondary | ICD-10-CM | POA: Diagnosis not present

## 2018-03-03 DIAGNOSIS — G4709 Other insomnia: Secondary | ICD-10-CM | POA: Diagnosis not present

## 2018-03-03 DIAGNOSIS — R0683 Snoring: Secondary | ICD-10-CM | POA: Diagnosis not present

## 2018-03-03 DIAGNOSIS — J323 Chronic sphenoidal sinusitis: Secondary | ICD-10-CM | POA: Diagnosis not present

## 2018-03-13 ENCOUNTER — Telehealth: Payer: Self-pay | Admitting: Gastroenterology

## 2018-03-13 NOTE — Telephone Encounter (Signed)
Patient states her prescription for medication Senna+ laxative tablets will run out before follow up appt on 1.20.2020. Patient wanting to know if Dr.[name] wants to give her a refill or have her stop the medication.

## 2018-03-13 NOTE — Telephone Encounter (Signed)
Spoke to patient. Dr Parke PoissonFang prescribed Senna S. She will pick up at drug store OTC and take as directed.

## 2018-03-20 ENCOUNTER — Telehealth: Payer: Self-pay | Admitting: Gastroenterology

## 2018-03-20 NOTE — Telephone Encounter (Signed)
Pt states Dr. Meridee Score prescribed 30 d supply of ferrous sulfate on 02/14/18.  Pt has a follow up 04/06/18; pt wants to know whether she should request refills from pharm or whether Dr. Meridee Score wanted to manage her iron levels for the upcoming appt.

## 2018-03-20 NOTE — Telephone Encounter (Signed)
Sent message to dr Meridee Score to advise on refilling Ferrous Sulfate

## 2018-03-20 NOTE — Telephone Encounter (Signed)
Left message for patient to call back  

## 2018-04-01 ENCOUNTER — Other Ambulatory Visit (INDEPENDENT_AMBULATORY_CARE_PROVIDER_SITE_OTHER): Payer: Medicare HMO

## 2018-04-01 DIAGNOSIS — R82998 Other abnormal findings in urine: Secondary | ICD-10-CM | POA: Diagnosis not present

## 2018-04-01 DIAGNOSIS — D649 Anemia, unspecified: Secondary | ICD-10-CM | POA: Diagnosis not present

## 2018-04-01 DIAGNOSIS — E7849 Other hyperlipidemia: Secondary | ICD-10-CM | POA: Diagnosis not present

## 2018-04-01 DIAGNOSIS — M859 Disorder of bone density and structure, unspecified: Secondary | ICD-10-CM | POA: Diagnosis not present

## 2018-04-01 DIAGNOSIS — I10 Essential (primary) hypertension: Secondary | ICD-10-CM | POA: Diagnosis not present

## 2018-04-01 LAB — FERRITIN: Ferritin: 38.9 ng/mL (ref 10.0–291.0)

## 2018-04-01 LAB — CBC WITH DIFFERENTIAL/PLATELET
Basophils Absolute: 0 10*3/uL (ref 0.0–0.1)
Basophils Relative: 0.5 % (ref 0.0–3.0)
Eosinophils Absolute: 0.2 10*3/uL (ref 0.0–0.7)
Eosinophils Relative: 2.2 % (ref 0.0–5.0)
HCT: 42.4 % (ref 36.0–46.0)
Hemoglobin: 14.4 g/dL (ref 12.0–15.0)
LYMPHS ABS: 2.6 10*3/uL (ref 0.7–4.0)
Lymphocytes Relative: 37.8 % (ref 12.0–46.0)
MCHC: 33.9 g/dL (ref 30.0–36.0)
MCV: 92.3 fl (ref 78.0–100.0)
Monocytes Absolute: 0.6 10*3/uL (ref 0.1–1.0)
Monocytes Relative: 8.5 % (ref 3.0–12.0)
Neutro Abs: 3.5 10*3/uL (ref 1.4–7.7)
Neutrophils Relative %: 51 % (ref 43.0–77.0)
Platelets: 264 10*3/uL (ref 150.0–400.0)
RBC: 4.59 Mil/uL (ref 3.87–5.11)
RDW: 15.9 % — ABNORMAL HIGH (ref 11.5–15.5)
WBC: 6.9 10*3/uL (ref 4.0–10.5)

## 2018-04-01 LAB — FOLATE: Folate: >24 ng/mL (ref >5.9)

## 2018-04-01 LAB — VITAMIN B12: Vitamin B-12: >1500 pg/mL — ABNORMAL HIGH (ref 211–911)

## 2018-04-01 LAB — IBC PANEL
Iron: 112 ug/dL (ref 42–145)
Saturation Ratios: 35.2 % (ref 20.0–50.0)
Transferrin: 227 mg/dL (ref 212.0–360.0)

## 2018-04-06 ENCOUNTER — Encounter: Payer: Self-pay | Admitting: Gastroenterology

## 2018-04-06 ENCOUNTER — Ambulatory Visit (INDEPENDENT_AMBULATORY_CARE_PROVIDER_SITE_OTHER): Payer: Medicare HMO | Admitting: Gastroenterology

## 2018-04-06 VITALS — BP 124/82 | HR 72 | Ht 64.5 in | Wt 164.0 lb

## 2018-04-06 DIAGNOSIS — K5900 Constipation, unspecified: Secondary | ICD-10-CM

## 2018-04-06 DIAGNOSIS — K449 Diaphragmatic hernia without obstruction or gangrene: Secondary | ICD-10-CM | POA: Diagnosis not present

## 2018-04-06 DIAGNOSIS — K279 Peptic ulcer, site unspecified, unspecified as acute or chronic, without hemorrhage or perforation: Secondary | ICD-10-CM | POA: Diagnosis not present

## 2018-04-06 DIAGNOSIS — D509 Iron deficiency anemia, unspecified: Secondary | ICD-10-CM | POA: Diagnosis not present

## 2018-04-06 DIAGNOSIS — K222 Esophageal obstruction: Secondary | ICD-10-CM | POA: Diagnosis not present

## 2018-04-06 DIAGNOSIS — K253 Acute gastric ulcer without hemorrhage or perforation: Secondary | ICD-10-CM | POA: Diagnosis not present

## 2018-04-06 MED ORDER — OMEPRAZOLE 40 MG PO CPDR: 40.0000 mg | DELAYED_RELEASE_CAPSULE | Freq: Two times a day (BID) | ORAL | 2 refills | Status: DC

## 2018-04-06 NOTE — Progress Notes (Signed)
[facility] VISIT   Primary Care Provider Burnard Bunting, MD [place] [address] [phone]  Patient Profile: Dana Mcgrath is a 73 y.o. female with a pmh significant for migraine/headaches, hyperlipidemia, hypertension, cervical joint disease, peptic ulcer disease.  The patient presents to the [facility] for an evaluation and management of problem(s) noted below:  Problem List 1. Iron deficiency anemia, unspecified iron deficiency anemia type   2. Peptic ulcer disease from NSAID use   3. Schatzki's ring of distal esophagus   4. Hiatal hernia   5. Cameron lesion, acute   6. Constipation, unspecified constipation type     History of Present Illness: This is the patient's first visit to the outpatient of our GI clinic.  I met this patient back in November when she presented with symptomatic anemia and an upper endoscopy was performed showing evidence of significant peptic ulcer disease.  Biopsies negative for H. pylori.  Full details are noted in the endoscopy report below.  The patient was started on high-dose PPI therapy and given iron supplementation.  She subsequently has done well and follows up today as scheduled.  We recently checked her iron indices and blood counts and things have improved significantly from her discharge.  Patient is feeling well.  She is having issues with harder stools at times which may be a result of the iron supplementation.  She had a recent Cologuard within the last year and had been hesitant about having a colonoscopy performed.  She asked today about the utility of collagen ingestion to help with skin/nails/joints.  Otherwise she has no significant other GI complaints at this time.  The patient is no longer taking any nonsteroidals.  Interestingly as well she is not having as many headaches which is great news.  GI Review of Systems Positive as above including very rare  pyrosis Negative for dysphagia, odynophagia, abdominal pain, nausea, vomiting, melena, hematochezia  Review of Systems General: Denies fevers/chills/weight loss HEENT: Denies oral lesions Cardiovascular: Denies chest pain/palpitations Pulmonary: Denies shortness of breath/cough Gastroenterological: See HPI Genitourinary: Denies darkened urine or hematuria Hematological: Denies easy bruising/bleeding Dermatological: Denies jaundice Psychological: Mood is stable   Medications Current Outpatient Medications  Medication Sig Dispense Refill  . acetaminophen (TYLENOL) 325 MG tablet Take 2 tablets (650 mg total) by mouth every 6 (six) hours as needed for mild pain (or Fever >/= 101). 30 tablet 0  . Ascorbic Acid (VITAMIN C PO) Take by mouth daily.    . Biotin w/ Vitamins C & E (HAIR/SKIN/NAILS PO) Take 1 tablet by mouth daily.    . Cholecalciferol (VITAMIN D-3 PO) Take by mouth daily.    Marland Kitchen co-enzyme Q-10 30 MG capsule Take 30 mg by mouth daily.    . Cyanocobalamin (VITAMIN B 12 PO) Take by mouth daily.    . fluticasone (FLONASE) 50 MCG/ACT nasal spray Place 2 sprays into both nostrils daily.   11  . Hyaluronic Acid 20-60 MG CAPS Take by mouth daily.    . Multiple Vitamins-Minerals (PRESERVISION AREDS 2 PO) Take by mouth daily.    . NON FORMULARY daily. Herbalife 3 capfuls daily prn with water    . senna-docusate (SENOKOT-S) 8.6-50 MG tablet Take 1 tablet by mouth at bedtime. 30 tablet 0  . ferrous sulfate 325 (65 FE) MG EC tablet Take 1 tablet (325 mg total) by mouth daily with breakfast. 30 tablet 0  . omeprazole (PRILOSEC) 40 MG capsule Take 1 capsule (40 mg total)  by mouth 2 (two) times daily for 30 days. 60 capsule 2   No current facility-administered medications for this visit.     Allergies Allergies  Allergen Reactions  . Codeine Nausea And Vomiting  . Prednisone Palpitations    Histories Past Medical History:  Diagnosis Date  . Cervical pain (neck)   . Headache   .  HLD (hyperlipidemia)   . Hypertension   . PONV (postoperative nausea and vomiting)   . Sinus disease   . Varicose veins    Past Surgical History:  Procedure Laterality Date  . ABDOMINAL HYSTERECTOMY     partial  . BIOPSY  02/14/2018   Procedure: BIOPSY;  Surgeon: Rush Landmark Telford Nab., MD;  Location: [facility];  Service: Gastroenterology;;  . BREAST REDUCTION SURGERY  2001  . ESOPHAGOGASTRODUODENOSCOPY N/A 02/14/2018   Procedure: ESOPHAGOGASTRODUODENOSCOPY (EGD);  Surgeon: Irving Copas., MD;  Location: [facility];  Service: Gastroenterology;  Laterality: N/A;  . ROTATOR CUFF REPAIR Right    Social History   Socioeconomic History  . Marital status: Married    Spouse name: Not on file  . Number of children: Not on file  . Years of education: Not on file  . Highest education level: Not on file  Occupational History  . Not on file  Social Needs  . Financial resource strain: Not on file  . Food insecurity:    Worry: Not on file    Inability: Not on file  . Transportation needs:    Medical: Not on file    Non-medical: Not on file  Tobacco Use  . Smoking status: Never Smoker  . Smokeless tobacco: Never Used  Substance and Sexual Activity  . Alcohol use: Yes    Comment: occasional   . Drug use: Never  . Sexual activity: Not on file  Lifestyle  . Physical activity:    Days per week: Not on file    Minutes per session: Not on file  . Stress: Not on file  Relationships  . Social connections:    Talks on phone: Not on file    Gets together: Not on file    Attends religious service: Not on file    Active member of club or organization: Not on file    Attends meetings of clubs or organizations: Not on file    Relationship status: Not on file  . Intimate partner violence:    Fear of current or ex partner: Not on file    Emotionally abused: Not on file    Physically abused: Not on file    Forced sexual activity: Not on file  Other Topics Concern  . Not  on file  Social History Narrative  . Not on file   Family History  Problem Relation Age of Onset  . Cancer Mother   . Diabetes Mother   . Hypertension Mother   . Cancer Father   . Hyperlipidemia Father   . Hypertension Father   . Varicose Veins Father   . Breast cancer Neg Hx   . Colon cancer Neg Hx   . Esophageal cancer Neg Hx   . Inflammatory bowel disease Neg Hx   . Liver disease Neg Hx   . Pancreatic cancer Neg Hx   . Rectal cancer Neg Hx   . Stomach cancer Neg Hx    I have reviewed her medical, social, and family history in detail and updated the electronic medical record as necessary.    PHYSICAL EXAMINATION  BP 124/82  Pulse 72   Ht 5' 4.5" (1.638 m)   Wt 164 lb (74.4 kg)   BMI 27.72 kg/m  Wt Readings from Last 3 Encounters:  04/06/18 164 lb (74.4 kg)  02/26/18 168 lb (76.2 kg)  02/14/18 169 lb 12.1 oz (77 kg)  GEN: NAD, appears stated age, doesn't appear chronically ill, accompanied by husband PSYCH: Cooperative, without pressured speech EYE: Conjunctivae pink, sclerae anicteric ENT: MMM, without oral ulcers, no erythema or exudates noted NECK: Supple CV: RR without R/Gs  RESP: CTAB posteriorly, without wheezing GI: NABS, soft, NT/ND, without rebound or guarding, no HSM appreciated MSK/EXT: No lower extremity edema SKIN: No jaundice NEURO:  Alert & Oriented x 3, no focal deficits   REVIEW OF DATA  I reviewed the following data at the time of this encounter:  GI Procedures and Studies  November 2019 endoscopy - No gross lesions in esophagus mucosa. - Widely patent and non-obstructing Schatzki ring. Biopsied to disrupt. - Hiatal hernia. Gastroesophageal flap valve classified as Hill Grade IV (no fold, wide open lumen, hiatal hernia present). Cameron's lesions present within hiatal hernia. - Erosive gastropathy. - Non-bleeding gastric ulcers with a clean ulcer base (Forrest Class III). Biopsied gastric lining to rule out HP. - Multiple duodenal ulcers  with a clean ulcer base (Forrest Class III) in the bulb and the D1/D2 angle/sweep. - No gross lesions in the second portion of the duodenum.  Laboratory Studies  Reviewed in epic  Imaging Studies  No relevant studies to review   ASSESSMENT  Ms. Nippert is a 73 y.o. female  with a pmh significant for migraine/headaches, hyperlipidemia, hypertension, cervical joint disease, peptic ulcer disease.  The patient is seen today for evaluation and management of:  1. Iron deficiency anemia, unspecified iron deficiency anemia type   2. Peptic ulcer disease from NSAID use   3. Schatzki's ring of distal esophagus   4. Hiatal hernia   5. Cameron lesion, acute   6. Constipation, unspecified constipation type    This is a hemodynamically and clinically stable patient who presents for planned follow-up regards to recent peptic ulcer disease which was related to significant NSAID use.  During endoscopy she was also found to have Cameron's lesions as well as a hiatal hernia with Schatzki's ring.  Patient is doing exceedingly well and having no issues of the stage of currently.  I want to keep the patient on her current dosing of PPI therapy until her planned follow-up endoscopy which we performed February into March.  At that time if her Schatzki ring is still present and she has any symptoms of dysphagia then I will plan to re-disrupt and dilate the stricture.  She is no longer iron deficient and seeing what her labs look like at this point in time I think it is reasonable to say that the etiologies of her iron deficiency and symptomatic anemia at the latter portion of last year was a result of her upper GI tract issues.  I am okay with holding on colonoscopy unless in the future she develops recurrent iron deficiency anemia at which point we would need to ensure she has not developed or had recurrent Cameron's lesions or ulcer disease.  The risks and benefits of endoscopic evaluation were discussed with the  patient; these include but are not limited to the risk of perforation, infection, bleeding, missed lesions, lack of diagnosis, severe illness requiring hospitalization, as well as anesthesia and sedation related illnesses.  The patient is agreeable to proceed.  All patient questions were answered, to the best of my ability, and the patient agrees to the aforementioned plan of action with follow-up as indicated.   PLAN  Plan for diagnostic/surveillance endoscopy at a 72-monthinterval from last procedure Continue omeprazole 40 mg twice daily until completion of repeat endoscopy Labs as outlined below to be done 1 week to 2 weeks before scheduled endoscopy and if evidence of iron deficiency anemia is present then we will plan on rescheduling as an EGD/colonoscopy Begin stool softeners 1-2 times daily Start fiber supplementation once daily for 2 weeks and then increase up to twice daily dosing thereafter Okay to consider the use of collagen supports via over-the-counter supplementation Continue to minimize nonsteroidals   Orders Placed This Encounter  Procedures  . IBC panel  . Ferritin    New Prescriptions   No medications on file   Modified Medications   Modified Medication Previous Medication   OMEPRAZOLE (PRILOSEC) 40 MG CAPSULE omeprazole (PRILOSEC) 40 MG capsule      Take 1 capsule (40 mg total) by mouth 2 (two) times daily for 30 days.    Take 1 capsule (40 mg total) by mouth 2 (two) times daily.    Planned Follow Up: No follow-ups on file.   GJustice Britain MD [facility] Office # [phone]

## 2018-04-06 NOTE — Patient Instructions (Addendum)
If you are age 73 or older, your body mass index should be between 23-30. Your Body mass index is 27.72 kg/m. If this is out of the aforementioned range listed, please consider follow up with your Primary Care Provider.  If you are age 73 or younger, your body mass index should be between 19-25. Your Body mass index is 27.72 kg/m. If this is out of the aformentioned range listed, please consider follow up with your Primary Care Provider.   It has been recommended to you by your physician that you have a(n) Endo/ L[facility]  Completed. We did not schedule the procedure(s) today due to availability. Please contact our office at [phone] in February to schedule procedure in late March 2020.    Stop Iron at the end of the month.   We have sent the following medications to your pharmacy for you to pick up at your convenience: Omeprazole   Stop Senna.   Start Docusate 100mg  twice daily   Start Fiber supplement once daily. Fibercon or Metamncil.     You will need follow up labs in March 2020.

## 2018-04-08 ENCOUNTER — Encounter: Payer: Self-pay | Admitting: Gastroenterology

## 2018-04-08 DIAGNOSIS — K279 Peptic ulcer, site unspecified, unspecified as acute or chronic, without hemorrhage or perforation: Secondary | ICD-10-CM | POA: Insufficient documentation

## 2018-04-08 DIAGNOSIS — I10 Essential (primary) hypertension: Secondary | ICD-10-CM | POA: Diagnosis not present

## 2018-04-08 DIAGNOSIS — K589 Irritable bowel syndrome without diarrhea: Secondary | ICD-10-CM | POA: Diagnosis not present

## 2018-04-08 DIAGNOSIS — R51 Headache: Secondary | ICD-10-CM | POA: Diagnosis not present

## 2018-04-08 DIAGNOSIS — K253 Acute gastric ulcer without hemorrhage or perforation: Secondary | ICD-10-CM | POA: Insufficient documentation

## 2018-04-08 DIAGNOSIS — E7849 Other hyperlipidemia: Secondary | ICD-10-CM | POA: Diagnosis not present

## 2018-04-08 DIAGNOSIS — M5412 Radiculopathy, cervical region: Secondary | ICD-10-CM | POA: Diagnosis not present

## 2018-04-08 DIAGNOSIS — M859 Disorder of bone density and structure, unspecified: Secondary | ICD-10-CM | POA: Diagnosis not present

## 2018-04-08 DIAGNOSIS — D6489 Other specified anemias: Secondary | ICD-10-CM | POA: Diagnosis not present

## 2018-04-08 DIAGNOSIS — I739 Peripheral vascular disease, unspecified: Secondary | ICD-10-CM | POA: Diagnosis not present

## 2018-04-08 DIAGNOSIS — R42 Dizziness and giddiness: Secondary | ICD-10-CM | POA: Diagnosis not present

## 2018-04-08 DIAGNOSIS — K449 Diaphragmatic hernia without obstruction or gangrene: Secondary | ICD-10-CM | POA: Insufficient documentation

## 2018-04-08 DIAGNOSIS — K5900 Constipation, unspecified: Secondary | ICD-10-CM | POA: Insufficient documentation

## 2018-04-08 DIAGNOSIS — K222 Esophageal obstruction: Secondary | ICD-10-CM | POA: Insufficient documentation

## 2018-04-08 DIAGNOSIS — Z0000 Encounter for general adult medical examination without abnormal findings: Secondary | ICD-10-CM | POA: Diagnosis not present

## 2018-04-08 DIAGNOSIS — D509 Iron deficiency anemia, unspecified: Secondary | ICD-10-CM | POA: Insufficient documentation

## 2018-04-08 DIAGNOSIS — Z1212 Encounter for screening for malignant neoplasm of rectum: Secondary | ICD-10-CM | POA: Diagnosis not present

## 2018-04-22 ENCOUNTER — Institutional Professional Consult (permissible substitution): Payer: Medicare HMO | Admitting: Neurology

## 2018-04-24 DIAGNOSIS — R42 Dizziness and giddiness: Secondary | ICD-10-CM | POA: Diagnosis not present

## 2018-04-24 DIAGNOSIS — E7849 Other hyperlipidemia: Secondary | ICD-10-CM | POA: Diagnosis not present

## 2018-04-24 DIAGNOSIS — I10 Essential (primary) hypertension: Secondary | ICD-10-CM | POA: Diagnosis not present

## 2018-04-24 DIAGNOSIS — Z6828 Body mass index (BMI) 28.0-28.9, adult: Secondary | ICD-10-CM | POA: Diagnosis not present

## 2018-04-28 ENCOUNTER — Telehealth: Payer: Self-pay | Admitting: Neurology

## 2018-04-28 NOTE — Telephone Encounter (Signed)
We have attempted to call the patient 2 times to schedule sleep study. Patient has been unavailable at the phone numbers we have on file and has not returned our calls. At this point we will send a letter asking pt to please contact the sleep lab to schedule their sleep study. If patient calls back we will schedule them for their sleep study. ° °

## 2018-05-04 DIAGNOSIS — H524 Presbyopia: Secondary | ICD-10-CM | POA: Diagnosis not present

## 2018-05-04 DIAGNOSIS — H02831 Dermatochalasis of right upper eyelid: Secondary | ICD-10-CM | POA: Diagnosis not present

## 2018-05-04 DIAGNOSIS — H0288A Meibomian gland dysfunction right eye, upper and lower eyelids: Secondary | ICD-10-CM | POA: Diagnosis not present

## 2018-05-04 DIAGNOSIS — H43821 Vitreomacular adhesion, right eye: Secondary | ICD-10-CM | POA: Diagnosis not present

## 2018-05-04 DIAGNOSIS — H2513 Age-related nuclear cataract, bilateral: Secondary | ICD-10-CM | POA: Diagnosis not present

## 2018-05-04 DIAGNOSIS — H02834 Dermatochalasis of left upper eyelid: Secondary | ICD-10-CM | POA: Diagnosis not present

## 2018-05-04 DIAGNOSIS — H5203 Hypermetropia, bilateral: Secondary | ICD-10-CM | POA: Diagnosis not present

## 2018-05-04 DIAGNOSIS — H52203 Unspecified astigmatism, bilateral: Secondary | ICD-10-CM | POA: Diagnosis not present

## 2018-05-04 DIAGNOSIS — H0288B Meibomian gland dysfunction left eye, upper and lower eyelids: Secondary | ICD-10-CM | POA: Diagnosis not present

## 2018-05-07 ENCOUNTER — Telehealth: Payer: Self-pay | Admitting: Gastroenterology

## 2018-05-07 NOTE — Telephone Encounter (Signed)
Patient has questions regarding supplements she's wanting to take.

## 2018-05-07 NOTE — Telephone Encounter (Signed)
The pt had questions about taking krill oil and omega 3.  She was advised to call her PCP with any questions regarding supplements. The pt has been advised of the information and verbalized understanding.

## 2018-05-11 DIAGNOSIS — I10 Essential (primary) hypertension: Secondary | ICD-10-CM | POA: Diagnosis not present

## 2018-05-11 DIAGNOSIS — Z6828 Body mass index (BMI) 28.0-28.9, adult: Secondary | ICD-10-CM | POA: Diagnosis not present

## 2018-05-20 ENCOUNTER — Other Ambulatory Visit: Payer: Self-pay | Admitting: Internal Medicine

## 2018-05-20 DIAGNOSIS — Z1231 Encounter for screening mammogram for malignant neoplasm of breast: Secondary | ICD-10-CM

## 2018-05-26 ENCOUNTER — Ambulatory Visit (AMBULATORY_SURGERY_CENTER): Payer: Self-pay | Admitting: *Deleted

## 2018-05-26 ENCOUNTER — Other Ambulatory Visit: Payer: Self-pay

## 2018-05-26 ENCOUNTER — Other Ambulatory Visit (INDEPENDENT_AMBULATORY_CARE_PROVIDER_SITE_OTHER): Payer: Medicare HMO

## 2018-05-26 VITALS — Ht 64.5 in | Wt 169.0 lb

## 2018-05-26 DIAGNOSIS — D509 Iron deficiency anemia, unspecified: Secondary | ICD-10-CM | POA: Diagnosis not present

## 2018-05-26 DIAGNOSIS — K279 Peptic ulcer, site unspecified, unspecified as acute or chronic, without hemorrhage or perforation: Secondary | ICD-10-CM | POA: Diagnosis not present

## 2018-05-26 LAB — IBC PANEL
Iron: 82 ug/dL (ref 42–145)
Saturation Ratios: 25.9 % (ref 20.0–50.0)
Transferrin: 226 mg/dL (ref 212.0–360.0)

## 2018-05-26 LAB — FERRITIN: Ferritin: 34.6 ng/mL (ref 10.0–291.0)

## 2018-05-26 NOTE — Progress Notes (Signed)
No egg or soy allergy known to patient  No issues with past sedation with any surgeries  or procedures, no intubation problems  No diet pills per patient No home 02 use per patient  No blood thinners per patient  No A fib or A flutter  EMMI video sent to pt's e mail   

## 2018-05-28 ENCOUNTER — Telehealth: Payer: Self-pay | Admitting: Gastroenterology

## 2018-05-28 NOTE — Telephone Encounter (Signed)
Pt return your call.

## 2018-06-01 ENCOUNTER — Telehealth: Payer: Self-pay | Admitting: Gastroenterology

## 2018-06-01 NOTE — Telephone Encounter (Signed)
Called and spoke with patient regarding iron supplements, she has a prescription for ferrous sulfate 325mg  , that she will continue until her follow- up with Dr Meridee Score.

## 2018-06-01 NOTE — Telephone Encounter (Signed)
Pt would like to know what iron deficiency she should be one since her PCP told her one and we told her another one.

## 2018-06-03 ENCOUNTER — Telehealth: Payer: Self-pay

## 2018-06-03 NOTE — Telephone Encounter (Signed)
Covid-19 travel screening questions  Have you traveled in the last 14 days? If yes where?  Do you now or have you had a fever in the last 14 days?  Do you have any respiratory symptoms of shortness of breath or cough now or in the last 14 days?  Do you have a medical history of Congestive Heart Failure?  Do you have a medical history of lung disease?  Do you have any family members or close contacts with diagnosed or suspected Covid-19?      Called patient at 9:38- left message for patient to return my call regarding screening questions.

## 2018-06-03 NOTE — Telephone Encounter (Signed)
Covid-19 travel screening questions  Have you traveled in the last 14 days? No If yes where?  Do you now or have you had a fever in the last 14 days? No Do you have any respiratory symptoms of shortness of breath or cough now or in the last 14 days? No Do you have a medical history of Congestive Heart Failure?  Do you have a medical history of lung disease?  Do you have any family members or close contacts with diagnosed or suspected Covid-19? No      

## 2018-06-03 NOTE — Telephone Encounter (Signed)
Pt just returned your call questions were not asked. Will be waiting for your call back

## 2018-06-04 ENCOUNTER — Ambulatory Visit (AMBULATORY_SURGERY_CENTER): Payer: Medicare HMO | Admitting: Gastroenterology

## 2018-06-04 ENCOUNTER — Other Ambulatory Visit: Payer: Self-pay

## 2018-06-04 ENCOUNTER — Encounter: Payer: Self-pay | Admitting: Gastroenterology

## 2018-06-04 VITALS — BP 146/72 | HR 81 | Temp 97.1°F | Resp 20 | Ht 64.0 in | Wt 169.0 lb

## 2018-06-04 DIAGNOSIS — K2980 Duodenitis without bleeding: Secondary | ICD-10-CM

## 2018-06-04 DIAGNOSIS — K222 Esophageal obstruction: Secondary | ICD-10-CM

## 2018-06-04 DIAGNOSIS — K449 Diaphragmatic hernia without obstruction or gangrene: Secondary | ICD-10-CM | POA: Diagnosis not present

## 2018-06-04 DIAGNOSIS — D509 Iron deficiency anemia, unspecified: Secondary | ICD-10-CM

## 2018-06-04 DIAGNOSIS — K259 Gastric ulcer, unspecified as acute or chronic, without hemorrhage or perforation: Secondary | ICD-10-CM

## 2018-06-04 DIAGNOSIS — K219 Gastro-esophageal reflux disease without esophagitis: Secondary | ICD-10-CM | POA: Diagnosis not present

## 2018-06-04 MED ORDER — SODIUM CHLORIDE 0.9 % IV SOLN: 500.0000 mL | Freq: Once | INTRAVENOUS | Status: DC

## 2018-06-04 NOTE — Op Note (Signed)
[facility] Patient Name: Dana Mcgrath Procedure Date: 06/04/2018 10:13 AM MRN: [id number] Endoscopist: Justice Britain , MD Age: 73 Referring MD:  Date of Birth: Nov 17, 1945 Gender: Female Account #: [id number] Procedure:                Upper GI endoscopy Indications:              Follow-up of gastric ulcer, Follow-up of duodenal                            ulcer Medicines:                Monitored Anesthesia Care Procedure:                Pre-Anesthesia Assessment:                           - Prior to the procedure, a History and Physical                            was performed, and patient medications and                            allergies were reviewed. The patient's tolerance of                            previous anesthesia was also reviewed. The risks                            and benefits of the procedure and the sedation                            options and risks were discussed with the patient.                            All questions were answered, and informed consent                            was obtained. Prior Anticoagulants: The patient has                            taken no previous anticoagulant or antiplatelet                            agents. ASA Grade Assessment: II - A patient with                            mild systemic disease. After reviewing the risks                            and benefits, the patient was deemed in                            satisfactory condition to undergo the procedure.  After obtaining informed consent, the endoscope was                            passed under direct vision. Throughout the                            procedure, the patient's blood pressure, pulse, and                            oxygen saturations were monitored continuously. The                            Model GIF-HQ190 [id number]) scope was introduced                            through the mouth, and advanced to the  second part                            of duodenum. The upper GI endoscopy was                            accomplished without difficulty. The patient                            tolerated the procedure. Scope In: Scope Out: Findings:                 No gross lesions were noted in the proximal                            esophagus, in the mid esophagus and in the distal                            esophagus.                           A widely patent and non-obstructing Schatzki ring                            was found at the gastroesophageal junction.                           A small hiatal hernia was present.                           Mild deformities from scar tissue in the regions of                            previous ulceration were noted mostly in the                            gastric antrum.                           One non-bleeding linear gastric ulcer with a clean  ulcer base (Forrest Class III) was found in the                            gastric antrum. The lesion was 6 mm in largest                            dimension. Much smaller than previously noted.                           No other gross lesions were noted in the entire                            examined stomach.                           Granular mucosa was found in the duodenal bulb.                            Biopsies were taken with a cold forceps for                            histology to rule out adenoma.                           No gross lesions were noted in the D1/D2 sweep and                            and in the second portion of the duodenum. Complications:            No immediate complications. Estimated Blood Loss:     Estimated blood loss was minimal. Impression:               - No gross lesions in esophagus. Widely patent and                            non-obstructing Schatzki ring. Small hiatal hernia.                           - Scar deformities in the gastric antrum  from                            healing ulcerations.                           - One non-bleeding gastric ulcer with a clean ulcer                            base (Forrest Class III) - improved from prior.                           - No other gross lesions in the stomach.                           - Granular mucosa in the duodenal bulb. Biopsied  to                            rule out adenoma.                           - No other gross lesions in the D1/D2 sweep and in                            the second portion of the duodenum. Recommendation:           - The patient will be observed post-procedure,                            until all discharge criteria are met.                           - Discharge patient to home.                           - Patient has a contact number available for                            emergencies. The signs and symptoms of potential                            delayed complications were discussed with the                            patient. Return to normal activities tomorrow.                            Written discharge instructions were provided to the                            patient.                           - Resume previous diet.                           - Await pathology results.                           - Repeat upper endoscopy in 3-4 months to check                            healing of the still persistent ulcer.                           - Continue 40 mg BID PPI for next 5-month (through                            April) and then decrease to 40 mg QD PPI. Will plan  to maintain at current dosing until follow up EGD.                           - No aspirin, ibuprofen, naproxen, or other                            non-steroidal anti-inflammatory drugs.                           - The findings and recommendations were discussed                            with the patient.                           - The findings and  recommendations were discussed                            with the patient's family. Justice Britain, MD 06/04/2018 10:42:53 AM

## 2018-06-04 NOTE — Progress Notes (Signed)
To PACU, VSS. Report to [email or web address] 

## 2018-06-04 NOTE — Patient Instructions (Signed)
Await pathology results. Repeat upper endoscopy in 3-4 months. Continue Omeprazole 40 mg twice a day for next two months (thru April), then decrease to 40 mg once a day. No aspirin, ibuprofen, naproxen, or other non-steriodal anti-inflammatory drugs.      YOU HAD AN ENDOSCOPIC PROCEDURE TODAY AT THE  ENDOSCOPY CENTER:   Refer to the procedure report that was given to you for any specific questions about what was found during the examination.  If the procedure report does not answer your questions, please call your gastroenterologist to clarify.  If you requested that your care partner not be given the details of your procedure findings, then the procedure report has been included in a sealed envelope for you to review at your convenience later.  YOU SHOULD EXPECT: Some feelings of bloating in the abdomen. Passage of more gas than usual.  Walking can help get rid of the air that was put into your GI tract during the procedure and reduce the bloating. If you had a lower endoscopy (such as a colonoscopy or flexible sigmoidoscopy) you may notice spotting of blood in your stool or on the toilet paper. If you underwent a bowel prep for your procedure, you may not have a normal bowel movement for a few days.  Please Note:  You might notice some irritation and congestion in your nose or some drainage.  This is from the oxygen used during your procedure.  There is no need for concern and it should clear up in a day or so.  SYMPTOMS TO REPORT IMMEDIATELY:    Following upper endoscopy (EGD)  Vomiting of blood or coffee ground material  New chest pain or pain under the shoulder blades  Painful or persistently difficult swallowing  New shortness of breath  Fever of 100F or higher  Black, tarry-looking stools  For urgent or emergent issues, a gastroenterologist can be reached at any hour by calling [phone].   DIET:  We do recommend a small meal at first, but then you may proceed to  your regular diet.  Drink plenty of fluids but you should avoid alcoholic beverages for 24 hours.  ACTIVITY:  You should plan to take it easy for the rest of today and you should NOT DRIVE or use heavy machinery until tomorrow (because of the sedation medicines used during the test).    FOLLOW UP: Our staff will call the number listed on your records the next business day following your procedure to check on you and address any questions or concerns that you may have regarding the information given to you following your procedure. If we do not reach you, we will leave a message.  However, if you are feeling well and you are not experiencing any problems, there is no need to return our call.  We will assume that you have returned to your regular daily activities without incident.  If any biopsies were taken you will be contacted by phone or by letter within the next 1-3 weeks.  Please call us at [phone] if you have not heard about the biopsies in 3 weeks.    SIGNATURES/CONFIDENTIALITY: You and/or your care partner have signed paperwork which will be entered into your electronic medical record.  These signatures attest to the fact that that the information above on your After Visit Summary has been reviewed and is understood.  Full responsibility of the confidentiality of this discharge information lies with you and/or your care-partner.

## 2018-06-04 NOTE — Progress Notes (Signed)
Called to room to assist during endoscopic procedure.  Patient ID and intended procedure confirmed with present staff. Received instructions for my participation in the procedure from the performing physician.  

## 2018-06-04 NOTE — Progress Notes (Signed)
Pt's states no medical or surgical changes since previsit or office visit. 

## 2018-06-05 ENCOUNTER — Telehealth: Payer: Self-pay

## 2018-06-05 NOTE — Telephone Encounter (Signed)
  Follow up Call-  Call back number 06/04/2018  Post procedure Call Back phone  # [phone]  Permission to leave phone message Yes  Some recent data might be hidden     Patient questions:  Do you have a fever, pain , or abdominal swelling? No. Pain Score  0 *  Have you tolerated food without any problems? Yes.    Have you been able to return to your normal activities? Yes.    Do you have any questions about your discharge instructions: Diet   No. Medications  No. Follow up visit  No.  Do you have questions or concerns about your Care? No.  Actions: * If pain score is 4 or above: No action needed, pain <4.

## 2018-06-09 ENCOUNTER — Encounter: Payer: Self-pay | Admitting: Gastroenterology

## 2018-06-10 ENCOUNTER — Telehealth: Payer: Self-pay | Admitting: Neurology

## 2018-06-10 ENCOUNTER — Encounter: Payer: Self-pay | Admitting: Neurology

## 2018-06-10 NOTE — Telephone Encounter (Signed)
Called the patient to inform them that our office has placed new protocols in place for our office visits. Due to the virus pandemic our office is reducing our number of office visits in order to minimize the risk to our patients and healthcare providers. Advised that our office is now providing the capability to offer the patients phone visits at this time. Informed of what that process looks like and informed that the telephone office visit will still be billed through insurance and due to [place] we need them to know since the appointment is taking place over the phone, we can't guarantee the security of the phone line. With that said if we do move forward I would have to get verbal consent to completed the call over the phone. Pt was agreeable to a telephone visit and gave verbal consent. I have reviewed the patient's chart and updated the medication list, allergies, history. Patient will be available for the visit by the phone at 11:15 am for 11:30 am.Pt verbalized understanding.

## 2018-06-11 ENCOUNTER — Telehealth: Payer: Self-pay | Admitting: Gastroenterology

## 2018-06-11 ENCOUNTER — Other Ambulatory Visit: Payer: Self-pay

## 2018-06-11 ENCOUNTER — Ambulatory Visit (INDEPENDENT_AMBULATORY_CARE_PROVIDER_SITE_OTHER): Payer: Medicare HMO | Admitting: Neurology

## 2018-06-11 ENCOUNTER — Encounter: Payer: Self-pay | Admitting: Neurology

## 2018-06-11 DIAGNOSIS — K2961 Other gastritis with bleeding: Secondary | ICD-10-CM

## 2018-06-11 DIAGNOSIS — G4440 Drug-induced headache, not elsewhere classified, not intractable: Secondary | ICD-10-CM

## 2018-06-11 DIAGNOSIS — Z9189 Other specified personal risk factors, not elsewhere classified: Secondary | ICD-10-CM | POA: Diagnosis not present

## 2018-06-11 DIAGNOSIS — T3995XA Adverse effect of unspecified nonopioid analgesic, antipyretic and antirheumatic, initial encounter: Secondary | ICD-10-CM

## 2018-06-11 DIAGNOSIS — R0683 Snoring: Secondary | ICD-10-CM | POA: Diagnosis not present

## 2018-06-11 DIAGNOSIS — D509 Iron deficiency anemia, unspecified: Secondary | ICD-10-CM

## 2018-06-11 DIAGNOSIS — D649 Anemia, unspecified: Secondary | ICD-10-CM

## 2018-06-11 NOTE — Telephone Encounter (Signed)
A letter was mailed today.

## 2018-06-11 NOTE — Telephone Encounter (Signed)
Pt called would like to know if the pathology results are back from her EGD.

## 2018-06-11 NOTE — Progress Notes (Signed)
SLEEP MEDICINE CLINIC   Provider:  Larey Seat, MD   Primary Care Physician:  Burnard Bunting, MD   Referring Provider: Burnard Bunting, MD   HPI:  Dana Mcgrath is a 73 y.o. female patient and originally seen in a referral from Dr. Reynaldo Minium for a sleep evaluation.  Now seen in Virtual phone visit:   Virtual Visit via Telephone Note  I connected with Dana Mcgrath on 06/11/18 at 11:30 AM EDT by telephone and verified that I am speaking with the correct person using two identifiers.   I discussed the limitations, risks, security and privacy concerns of performing an evaluation and management service by telephone and the availability of in person appointments. I also discussed with the patient that there may be a patient responsible charge related to this service. The patient expressed understanding and agreed to proceed.     History of Present Illness: Interval history Dana Mcgrath has been seen by Dr. Reynaldo Minium her primary care physician on February 19, 2018 visit significant anemia status post acute hospitalization for symptomatic anemia after GI bleed, hemoglobin was 7.6 in the office last Friday. The patient had taken a lot of Goody powders Excedrin Migraine etc. causing the GI bleed most likely.  Erosive gastritis was the diagnosis, she had an upper GI endoscopy and now has recovered somewhat from her anemia she is neither tachycardic nor hypotensive she states.  Our visit and a sleep consultation from 26 February 2018 at concluded with the patient's need for a sleep test.  She states that she no longer has headaches at this time her hypertension has been well controlled she did not need to start on hypertensive medication in spite of losing blood.  Her vertigo is gone, and she states that she is not excessively daytime sleepy either.  She is using a Flonase spray just before sleep time not every night but most nights.  She also hopes that the CT findings of a fluid  collection in the right nausea and may not need surgical treatment if she can improve the sinus drainage.  Dr. Redmond Baseman ENT.  She had no recurrence of black stools, she tolerates the iron reasonably well by mouth.  She has started on Maxide.     Observations/Objective: Due to the COVID 19 pandemic we are not able to perform in lab sleep studies and the patient will need to have a home sleep test, which she prefers anyway.  She is also still the caretaker of her ailing husband.  She could also be potentially a candidate for a dental device for the inspire technique showed her sleep apnea diagnosis be mild and not REM sleep dependent and not associated with hypoxemia.   Assessment and Plan: I will ask for a home sleep test the patient agreed I explained to her that this will be a disposable test due to the circumstances.  The test kit will be sent to her and will be uploaded on the cloud for which she needs a smart phone or other Wi-Fi access.   Once we find the data have been have been transmitted we will allow the patient to dispose of the whole device.  She is agreeable with this and we will follow-up in (hopefully) 3 to 6 months face-to-face.           The patient reports she is in need of a headache specialist, has sinus problems, ulcers for taking too much medications, previously see by Dr. Domingo Cocking. She has taken a  lot of goody powders, Excedrin Migraine, etc.. Dana Mcgrath reports that she had on and off problems with sinus congestion and nasal congestion for years, and that has kept her recently more often from breathing easily at night.  She feels she cannot breathe and she wakes up from it, her husband has also heard her snore and has noticed the intermittent pauses in her breathing. Crescendo snoring.  It is interesting that Dana Mcgrath attributes most of her problems and sleep and headaches to the sinus congestion but has never been seen by an ENT specialist so far.  Sleep habits are  as follows: Dinner time is 5-6 PM, and bedtime is as late as 10.30 PM. No TV in the bedroom, cool ,a quiet and dark environment.  If she uses a nose spray and Tylenol Sinus before bedtime, she can sleep for 4 hours, than she wakes up and is congested again. She feels a neck stiffness and fluid pressure in her nostril, sinus .  She sleeps less well during weather changes, and she has neck pain.  She has 2-3 nocturia.  She will take a second tylenol pill and goes back to sleep - certainly over-user.  total nocturnal sleep time, 6-7 hours.    Sleep medical history and family sleep history: years of sinus headaches and medication overuse. Snoring for several month. Father had HA. Eldest brother has sinus HA.   Social history: married, retired,  Non smoker, non ETOH- caffeine - only in  Excedrine or similar. One daughter, 27.  One grandson 16. Retired from Insurance underwriter / Scientist, product/process development.   Review of Systems: Out of a complete 14 system review, the patient complains of only the following symptoms, and all other reviewed systems are negative. Uses  imitrex, generic, but has a limited supply.  HA pain relieve OTC. Goody powders.   Epworth score  2 , Fatigue severity score 9  , depression score 0/ 15    Social History   Socioeconomic History  . Marital status: Married    Spouse name: Not on file  . Number of children: Not on file  . Years of education: Not on file  . Highest education level: Not on file  Occupational History  . Not on file  Social Needs  . Financial resource strain: Not on file  . Food insecurity:    Worry: Not on file    Inability: Not on file  . Transportation needs:    Medical: Not on file    Non-medical: Not on file  Tobacco Use  . Smoking status: Never Smoker  . Smokeless tobacco: Never Used  Substance and Sexual Activity  . Alcohol use: Yes    Comment: occasional   . Drug use: Never  . Sexual activity: Not on file  Lifestyle  . Physical activity:    Days per week:  Not on file    Minutes per session: Not on file  . Stress: Not on file  Relationships  . Social connections:    Talks on phone: Not on file    Gets together: Not on file    Attends religious service: Not on file    Active member of club or organization: Not on file    Attends meetings of clubs or organizations: Not on file    Relationship status: Not on file  . Intimate partner violence:    Fear of current or ex partner: Not on file    Emotionally abused: Not on file    Physically abused: Not  on file    Forced sexual activity: Not on file  Other Topics Concern  . Not on file  Social History Narrative  . Not on file    Family History  Problem Relation Age of Onset  . Cancer Mother   . Diabetes Mother   . Hypertension Mother   . Esophageal cancer Mother   . Cancer Father   . Hyperlipidemia Father   . Hypertension Father   . Varicose Veins Father   . Colon polyps Brother   . Breast cancer Neg Hx   . Colon cancer Neg Hx   . Inflammatory bowel disease Neg Hx   . Liver disease Neg Hx   . Pancreatic cancer Neg Hx   . Rectal cancer Neg Hx   . Stomach cancer Neg Hx     Past Medical History:  Diagnosis Date  . Allergy   . Anemia   . Blood transfusion without reported diagnosis   . Cataract   . Cervical pain (neck)   . Headache   . HLD (hyperlipidemia)   . Hypertension   . PONV (postoperative nausea and vomiting)   . Sinus disease   . Varicose veins     Past Surgical History:  Procedure Laterality Date  . ABDOMINAL HYSTERECTOMY     partial  . BIOPSY  02/14/2018   Procedure: BIOPSY;  Surgeon: Rush Landmark Telford Nab., MD;  Location: [facility];  Service: Gastroenterology;;  . BREAST REDUCTION SURGERY  2001  . COLONOSCOPY    . ESOPHAGOGASTRODUODENOSCOPY N/A 02/14/2018   Procedure: ESOPHAGOGASTRODUODENOSCOPY (EGD);  Surgeon: Irving Copas., MD;  Location: [facility];  Service: Gastroenterology;  Laterality: N/A;  . ROTATOR CUFF REPAIR Right   . UPPER  GASTROINTESTINAL ENDOSCOPY      Current Outpatient Medications  Medication Sig Dispense Refill  . acetaminophen (TYLENOL) 325 MG tablet Take 2 tablets (650 mg total) by mouth every 6 (six) hours as needed for mild pain (or Fever >/= 101). 30 tablet 0  . ALOE VERA CONCENTRATE PO Take by mouth.    . Ascorbic Acid (VITAMIN C PO) Take by mouth daily.    . Biotin w/ Vitamins C & E (HAIR/SKIN/NAILS PO) Take 1 tablet by mouth daily.    . Cholecalciferol (VITAMIN D-3 PO) Take by mouth daily.    . Cyanocobalamin (VITAMIN B 12 PO) Take by mouth daily.    . ferrous sulfate 325 (65 FE) MG tablet Take 325 mg by mouth daily with breakfast.    . fluticasone (FLONASE) 50 MCG/ACT nasal spray Place 2 sprays into both nostrils daily.   11  . Hyaluronic Acid 20-60 MG CAPS Take by mouth daily.    . Multiple Vitamins-Minerals (PRESERVISION AREDS 2 PO) Take by mouth daily.    Marland Kitchen olmesartan (BENICAR) 20 MG tablet     . omeprazole (PRILOSEC) 40 MG capsule Take 40 mg by mouth daily.    . polyethylene glycol (MIRALAX / GLYCOLAX) packet Take 17 g by mouth daily.    . Psyllium (METAMUCIL FIBER PO) Take by mouth.     No current facility-administered medications for this visit.     Allergies as of 06/11/2018 - Review Complete 06/10/2018  Allergen Reaction Noted  . Aspirin Other (See Comments) 03/03/2018  . Codeine Nausea And Vomiting 11/25/2014  . Other  05/26/2018  . Prednisone Palpitations 11/25/2014    Vitals: There were no vitals taken for this visit. Last Weight:  Wt Readings from Last 1 Encounters:  06/04/18 169 lb (76.7 kg)  WIO:XBDZH is no height or weight on file to calculate BMI.     Last Height:   Ht Readings from Last 1 Encounters:  06/04/18 5' 4" (1.626 m)   Follow Up Instructions: HST , await results.    I discussed the assessment and treatment plan with the patient. The patient was provided an opportunity to ask questions and all were answered. The patient agreed with the plan and  demonstrated an understanding of the instructions. We also discussed the COVID 19 guidelines and recommendation of patient's of her age and her husband's age.    The patient was advised to call back or seek an in-person evaluation if the symptoms worsen or if the condition fails to improve as anticipated.  I provided 16 minutes of non-face-to-face time during this encounter.   Larey Seat, MD   Larey Seat, MD 2/99/2426, 83:41 AM  Certified in Neurology by ABPN Certified in Sleep Medicine by [facility] [address] [place], [place] [address]

## 2018-06-15 ENCOUNTER — Ambulatory Visit: Payer: Medicare HMO | Admitting: Neurology

## 2018-06-15 ENCOUNTER — Encounter

## 2018-06-16 NOTE — Telephone Encounter (Signed)
Pt stated that she has not received the letter and requested a call to discuss EGD results.

## 2018-06-17 NOTE — Telephone Encounter (Signed)
Reviewed her biopsy letter with her. She states she is doing well and will continue to take the Prilosec BID through April, then reduce to once daily. This was discussed at her EGD procedure appointment. She will continue her once daily iron as discussed after her 05/27/18 labs.

## 2018-06-22 ENCOUNTER — Other Ambulatory Visit: Payer: Self-pay

## 2018-06-22 ENCOUNTER — Ambulatory Visit (INDEPENDENT_AMBULATORY_CARE_PROVIDER_SITE_OTHER): Payer: Medicare HMO | Admitting: Neurology

## 2018-06-22 DIAGNOSIS — G4733 Obstructive sleep apnea (adult) (pediatric): Secondary | ICD-10-CM

## 2018-06-22 DIAGNOSIS — D649 Anemia, unspecified: Secondary | ICD-10-CM

## 2018-06-22 DIAGNOSIS — R0683 Snoring: Secondary | ICD-10-CM

## 2018-06-22 DIAGNOSIS — G4440 Drug-induced headache, not elsewhere classified, not intractable: Secondary | ICD-10-CM

## 2018-06-22 DIAGNOSIS — Z9189 Other specified personal risk factors, not elsewhere classified: Secondary | ICD-10-CM

## 2018-06-22 DIAGNOSIS — T3995XA Adverse effect of unspecified nonopioid analgesic, antipyretic and antirheumatic, initial encounter: Secondary | ICD-10-CM

## 2018-06-22 DIAGNOSIS — D509 Iron deficiency anemia, unspecified: Secondary | ICD-10-CM

## 2018-06-22 DIAGNOSIS — K2961 Other gastritis with bleeding: Secondary | ICD-10-CM

## 2018-06-29 ENCOUNTER — Ambulatory Visit: Payer: Medicare HMO

## 2018-07-08 ENCOUNTER — Telehealth: Payer: Self-pay | Admitting: Neurology

## 2018-07-08 MED ORDER — TRIAMCINOLONE ACETONIDE 55 MCG/ACT NA AERO: 2.0000 | INHALATION_SPRAY | Freq: Every day | NASAL | 12 refills | Status: DC

## 2018-07-08 NOTE — Procedures (Signed)
Patient Information     First Name: Dana Last Name: Mcgrath ID: [id number]  Birth Date: 01-05-1946 Age: 73 Gender: Female  Referring Doctor: Geoffry Paradise, MD BMI: 29.0 (W=170 lb, H=5' 4'')  Reading Doctor: Neck Circ: Melvyn Novas, MD  15 '' Epworth:  2/24                  Fatigue severity score 9  Sleep Study Information    Study Date: Jun 22, 2018   S/H/A Version: [phone] / 4.1.1531 / 64  History:       Dana Mcgrath has significant anemia status post-acute hospitalization for symptomatic anemia after GI bleed, hemoglobin was 7.6 in the office last Friday. The patient had taken a lot of Goody powders Excedrin Migraine etc. causing the GI bleed most likely.  Erosive gastritis was the diagnosis, she had an upper GI endoscopy and now has recovered somewhat from her blood loss- she is neither tachycardia nor hypotensive she states.  Our visit and a sleep consultation from 26 February 2018 at concluded with the patient's need for a sleep test. The patient reports she is in need of a headache specialist, has sinus problems, ulcers for taking too much medications, previously see by Dr. Neale Burly. She has taken a lot of goody powders, Excedrin Migraine, etc. Dana Mcgrath reports that she had on and off problems with sinus congestion and nasal congestion for years, and that has kept her recently more often from breathing easily at night.  She feels she cannot breathe and she wakes up from gasping, her husband has also heard her snore and has noticed the intermittent pauses in her breathing/ Crescendo snoring. She attributes most of her problems and sleep and headaches to the sinus congestion but has never been seen by an ENT specialist so far. .  Summary & Diagnosis:    Severe Obstructive Sleep Apnea at an AHI of 33.6/h and with strong accentuation during REM Sleep, the REM AHI was 53/h. There was no clinically relevant hypoxemia noted.   Recommendations:     Severe Sleep Apnea with REM  sleep dependence usually requires CPAP therapy, which can be a problem in this patient with nasal patency problems, Sinusitis.  I would strongly recommend an ENT visit before starting CPAP, thus increasing her chances of compliance and better tolerance. I will order CPAP for May 2020 to begin, allowing her a Video/ Phone guided ENT visit in the meantime.  Electronically Signed: Melvyn Novas, MD   07-08-2018        Sleep Summary  Oxygen Saturation Statistics   Start Study Time: End Study Time: Total Recording Time:     10:00:51 PM 6:27:02 AM     8 h, 26 min  Total Sleep Time % REM of Sleep Time:  6 h, 54 min  14.8 %    Mean: 93% Minimum: 76% Maximum: 99%  Mean Saturations Nadir (SpO2%):   90%  Oxygen Desaturation in %:   4-9 10-20 >20 Total  Events Number Total   118  19 86.1 13.9  0 0.0  137 100.0  Oxygen Saturation: <90 <=88 <85 <80 <70  Duration (minutes): Sleep % 18.1 4.4 11.8 2.2 2.8 0.5 0.4 0.1 0.0 0.0     Respiratory Indices      Total Events REM NREM All Night  pRDI:  233  pAHI:  229 ODI:  137  pAHIc:  15  % CSR: 0.0 53.0 53.0 48.1 2.9 30.9 30.2 15.2 2.1 34.2 33.6  20.1 2.2       Pulse Rate Statistics during Sleep (BPM)      Mean: 70 Minimum: 54  Maximum: 90    Indices are calculated using technically valid sleep time of  6 hrs, 49 min. pRDI/pAHI are calculated using oxi desaturations ? 3%   Body Position Statistics  Position Supine Prone Right Left Non-Supine  Sleep (min) 296.2 0.0 100.5 17.5 118.0  Sleep % 71.5 0.0 24.3 4.2 28.5  pRDI 34.9 N/A 34.4 20.8 32.4  pAHI 34.1 N/A 34.4 20.8 32.4  ODI 20.3 N/A 21.7 6.9 19.5     Snoring Statistics Snoring Level (dB) >40 >50 >60 >70 >80 >Threshold (45)  Sleep (min) 112.2 22.4 2.2 0.0 0.0 52.7  Sleep % 27.1 5.4 0.5 0.0 0.0 12.7    Mean: 42 dB Sleep Stages Chart                                                   pAHI=33.6                                                                Mild              Moderate                    Severe                                                 5              15                    30  * Refe

## 2018-07-08 NOTE — Addendum Note (Signed)
Addended by: Melvyn Novas on: 07/08/2018 01:07 PM   Modules accepted: Orders

## 2018-07-08 NOTE — Telephone Encounter (Signed)
Called the patient and reviewed the SS with her. Informed her of the severe sleep apnea. Advised the patient Dr Vickey Huger recommends the patient follow up with ENT prior to starting CPAP to increase chances of being able to use the machine. Patient states that Dr Jenne Pane was working with her and advised of a surgery that would be done they may help with opening airway patency. I advised to touch base with Dr Jenne Pane to make him aware of the sleep study results. informed the patient of medicare guidelines in making sure that she has started CPAP therapy within 6 mths of the office visit and sleep study. Patient requested a copy be mailed to her of the SS to review with Dr Jenne Pane and informed the patient to update [place] on the status of when we should get the patient set up with cpap. Pt verbalized understanding.

## 2018-07-08 NOTE — Telephone Encounter (Signed)
-----   Message from Melvyn Novas, MD sent at 07/08/2018  1:07 PM EDT ----- Severe Obstructive Sleep Apnea at an AHI of 33.6/h and with strong accentuation during REM Sleep, the REM AHI was 53/h. There was no clinically relevant hypoxemia noted.   Recommendations:    Severe Sleep Apnea with REM sleep dependence usually requires CPAP therapy, which can be a problem in this patient with nasal patency problems, Sinusitis.  I would strongly recommend an ENT visit before starting CPAP, thus increasing her chances of compliance and better tolerance. I will order CPAP for May 2020 to begin, allowing her a Video/ Phone guided ENT visit in the meantime. CPAP therapy may improve her headaches significantly.  Electronically Signed: Melvyn Novas, MD   07-08-2018

## 2018-07-28 ENCOUNTER — Encounter: Payer: Self-pay | Admitting: Gastroenterology

## 2018-08-01 IMAGING — MG DIGITAL SCREENING BILATERAL MAMMOGRAM WITH TOMO AND CAD
8 series · 8 of 24 positions shown · non-contrast
Comparison: Previous exam(s).

CLINICAL DATA: Screening.

EXAM:
DIGITAL SCREENING BILATERAL MAMMOGRAM WITH TOMO AND CAD

[L CC synth-2D]
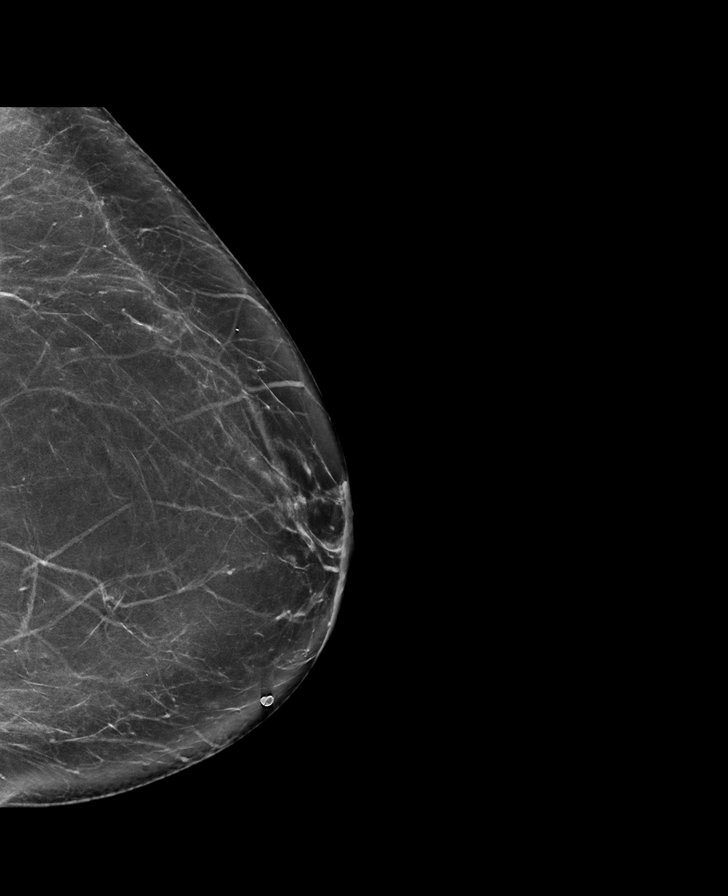

[R MLO synth-2D]
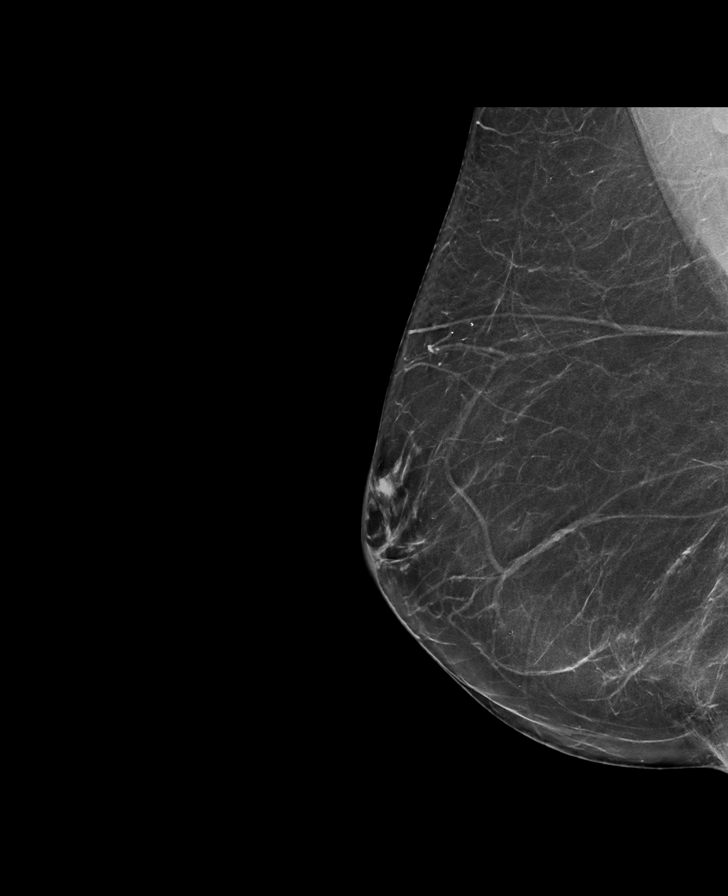

[L MLO synth-2D]
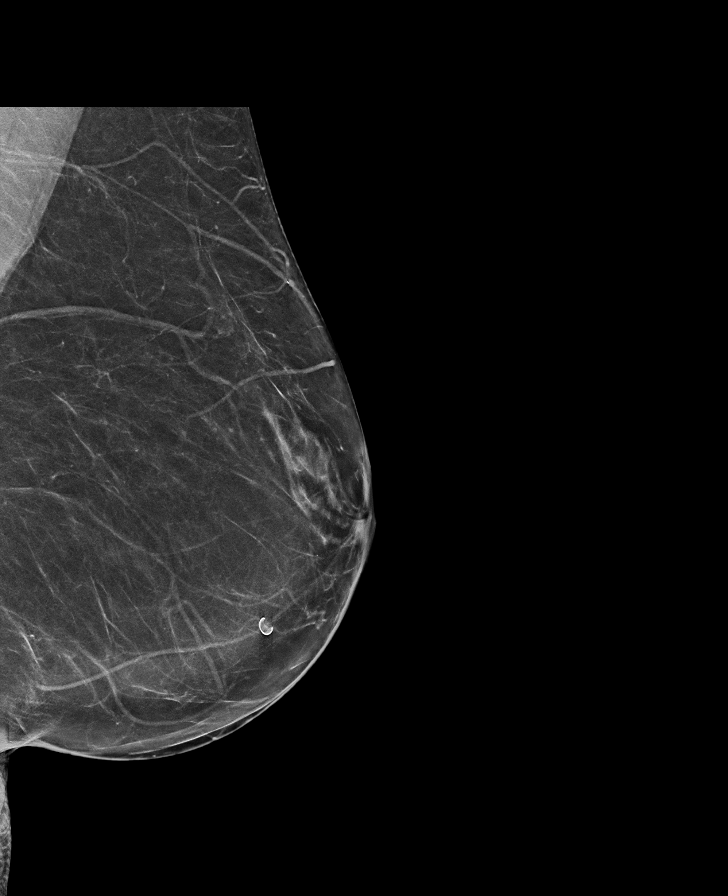

[R CC synth-2D]
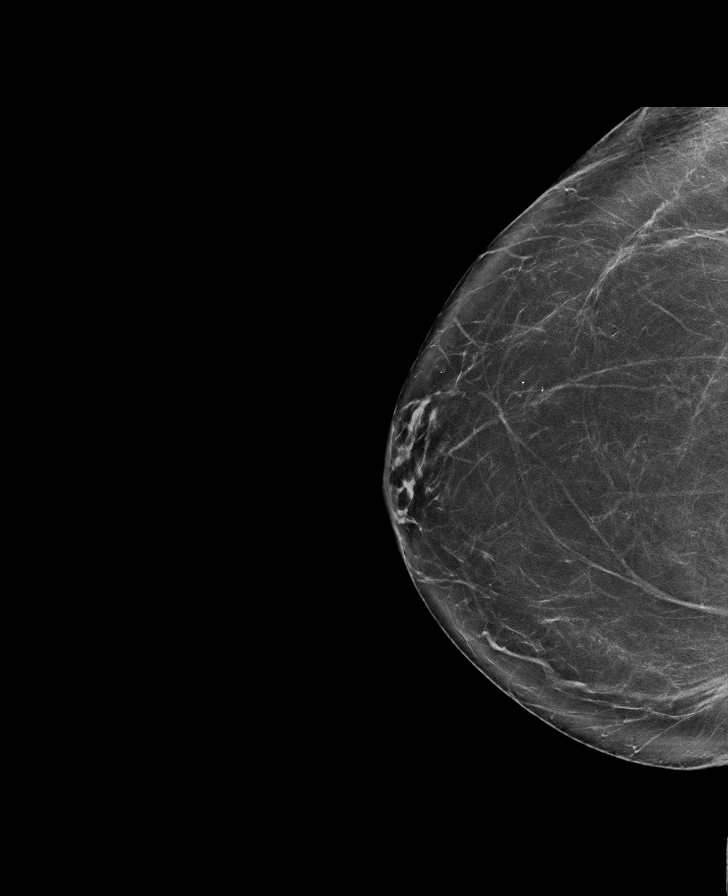

[L CC tomo · tomo slice 39/78.0]
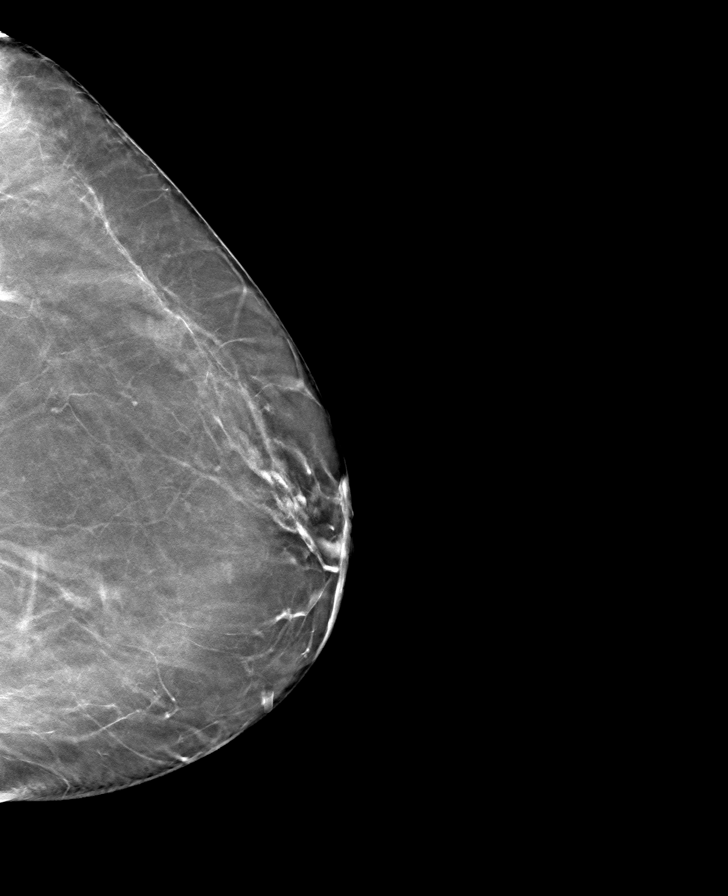

[R CC tomo · tomo slice 39/77.0]
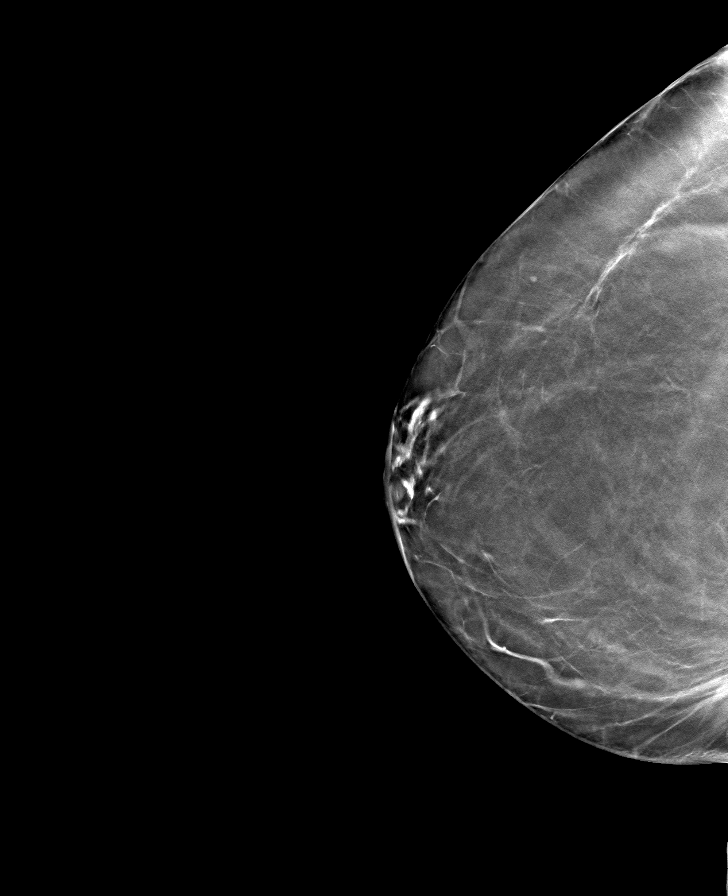

[L MLO tomo · tomo slice 37/72.0]
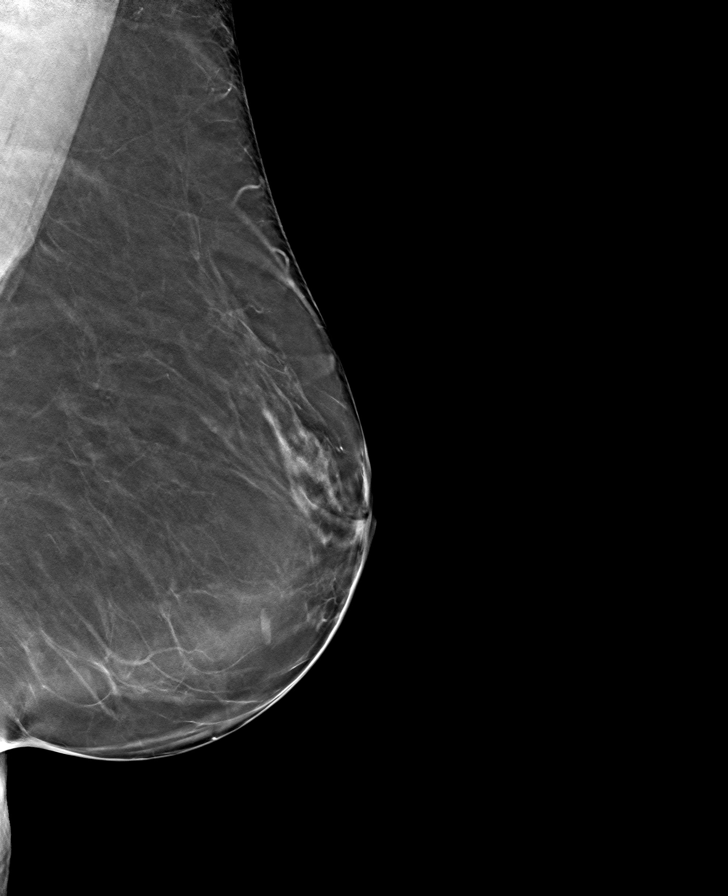

[R MLO tomo · tomo slice 37/73.0]
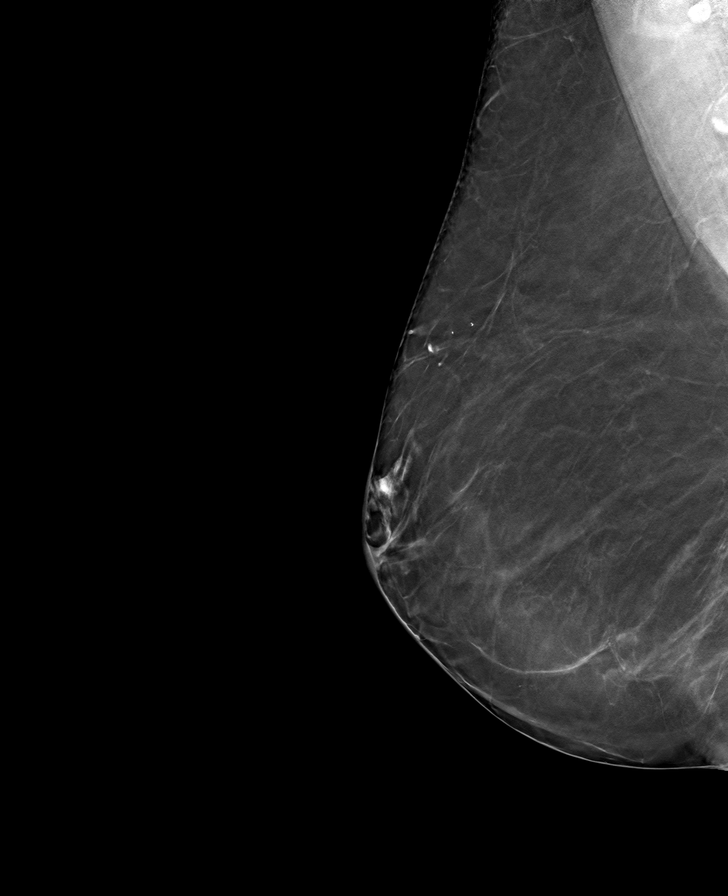

[8 of 24 positions shown; findings below may reference images not displayed]

ACR Breast Density Category b: There are scattered areas of
fibroglandular density.
FINDINGS: There are no findings suspicious for malignancy. Images were
processed with CAD.
IMPRESSION: No mammographic evidence of malignancy. A result letter of this
screening mammogram will be mailed directly to the patient.

RECOMMENDATION:
Screening mammogram in one year. (Code:[id number])

BI-RADS CATEGORY  1: Negative.

## 2018-08-11 DIAGNOSIS — M5136 Other intervertebral disc degeneration, lumbar region: Secondary | ICD-10-CM | POA: Diagnosis not present

## 2018-08-11 DIAGNOSIS — D7589 Other specified diseases of blood and blood-forming organs: Secondary | ICD-10-CM | POA: Diagnosis not present

## 2018-08-11 DIAGNOSIS — R69 Illness, unspecified: Secondary | ICD-10-CM | POA: Diagnosis not present

## 2018-08-13 ENCOUNTER — Ambulatory Visit: Payer: Medicare HMO

## 2018-08-19 DIAGNOSIS — J323 Chronic sphenoidal sinusitis: Secondary | ICD-10-CM | POA: Diagnosis not present

## 2018-08-19 DIAGNOSIS — J343 Hypertrophy of nasal turbinates: Secondary | ICD-10-CM | POA: Diagnosis not present

## 2018-08-27 ENCOUNTER — Ambulatory Visit
Admission: RE | Admit: 2018-08-27 | Discharge: 2018-08-27 | Disposition: A | Discharge status: Home / Self Care | Payer: Medicare HMO | Source: Ambulatory Visit | Attending: Internal Medicine | Admitting: Internal Medicine

## 2018-08-27 ENCOUNTER — Other Ambulatory Visit: Payer: Self-pay

## 2018-08-27 DIAGNOSIS — Z1231 Encounter for screening mammogram for malignant neoplasm of breast: Secondary | ICD-10-CM | POA: Diagnosis not present

## 2018-09-02 ENCOUNTER — Encounter: Payer: Self-pay | Admitting: Neurology

## 2018-09-02 ENCOUNTER — Telehealth: Payer: Self-pay | Admitting: Neurology

## 2018-09-02 ENCOUNTER — Encounter: Payer: Self-pay | Admitting: Gastroenterology

## 2018-09-02 DIAGNOSIS — H524 Presbyopia: Secondary | ICD-10-CM | POA: Diagnosis not present

## 2018-09-02 DIAGNOSIS — H0288A Meibomian gland dysfunction right eye, upper and lower eyelids: Secondary | ICD-10-CM | POA: Diagnosis not present

## 2018-09-02 DIAGNOSIS — H43821 Vitreomacular adhesion, right eye: Secondary | ICD-10-CM | POA: Diagnosis not present

## 2018-09-02 DIAGNOSIS — H5203 Hypermetropia, bilateral: Secondary | ICD-10-CM | POA: Diagnosis not present

## 2018-09-02 DIAGNOSIS — H0288B Meibomian gland dysfunction left eye, upper and lower eyelids: Secondary | ICD-10-CM | POA: Diagnosis not present

## 2018-09-02 DIAGNOSIS — H52203 Unspecified astigmatism, bilateral: Secondary | ICD-10-CM | POA: Diagnosis not present

## 2018-09-02 DIAGNOSIS — H35341 Macular cyst, hole, or pseudohole, right eye: Secondary | ICD-10-CM | POA: Diagnosis not present

## 2018-09-02 NOTE — Telephone Encounter (Signed)
Called the patient to inform them that our office has placed new protocols in place for our office visits. Due to Covid 19 our office is reducing our number of office visits in order to minimize the risk to our patients and healthcare providers.Our office is now providing the capability to offer the patients virtual visits at this time. Informed of what that process looks like and informed that the Virtual visit will still be billed through insurance as such. Due to [place],informed the patient since the appointment is taking place over the phone/internet app, we can't guarantee the security of the phone line. With that said if we do move forward I would have to get verbal consent to complete the Video Visit/Phone call. Patient gave verbal consent to move forward with the video visit. I have reviewed the patient's chart and made sure that everything is up to date. Patient is also made aware that since this is a video visit we are able to complete the visit but a physical exam is not able to be done since the patient is not present in person. Pt request the link be texted/emailed to them. Pt informed that the front staff will contact the patient aprox 30 minutes prior to the scheduled appointment to "check them in" and make sure that everything is ready for the appointment to get started. Pt verbalized understanding of this information and will states to be ready for the visit at least 15-30 min prior to the visit. Reminded the patient once more that this is treated as a Office visit and the patient must be prepared for the visit and ready at the time of their appointment preferably in a well lit area where they have good connection for the visit. Pt verbalized understanding.   [facility] VIDEO VISIT

## 2018-09-02 NOTE — Telephone Encounter (Signed)
Pt is asking for a call from [place] re: the questions she was given by Marin Olp for Dr Dohmeier for her upcoming appointment

## 2018-09-02 NOTE — Telephone Encounter (Signed)
Pt was unable to reply to the message to answer the epworth questions. I have taking the answers over the phone. Pt verbalized understanding.

## 2018-09-03 ENCOUNTER — Encounter: Payer: Self-pay | Admitting: Neurology

## 2018-09-03 ENCOUNTER — Telehealth (INDEPENDENT_AMBULATORY_CARE_PROVIDER_SITE_OTHER): Payer: Medicare HMO | Admitting: Neurology

## 2018-09-03 ENCOUNTER — Telehealth: Payer: Self-pay | Admitting: Neurology

## 2018-09-03 DIAGNOSIS — J323 Chronic sphenoidal sinusitis: Secondary | ICD-10-CM

## 2018-09-03 DIAGNOSIS — G4733 Obstructive sleep apnea (adult) (pediatric): Secondary | ICD-10-CM | POA: Diagnosis not present

## 2018-09-03 NOTE — Telephone Encounter (Signed)
Called the patient to make her aware I will send the order for auto CPAP to [facility] per the pt preference. There was no answer. LVM with this information and instructed the pt would have to call back and scheduled her initial cpap follow up visit in 31-90 days after starting the machine. I would state best to offer an apt mid to late aug. This can be with Dr Brett Fairy, Ward Givens or Amy Lomax as last option if other's dont have opening.

## 2018-09-03 NOTE — Progress Notes (Signed)
SLEEP MEDICINE CLINIC   Provider:  Larey Seat, MD   Primary Care Physician:  Burnard Bunting, MD   Referring Provider: Burnard Bunting, MD     HPI:  Dana Mcgrath is a 73 y.o. female patient and originally seen in a referral from Dr. Reynaldo Minium for a sleep evaluation.  Now seen in Virtual video visit on 09-03-2018.  Virtual Visit via video Note  I connected with Dana Mcgrath on 09/03/18 at 10:30 AM EDT by telephone and verified that I am speaking with the correct person using two identifiers.   I discussed the limitations, risks, security and privacy concerns of performing an evaluation and management service by telephone and the availability of in person appointments. I also discussed with the patient that there may be a patient responsible charge related to this service. The patient expressed understanding and agreed to proceed.     History of Present Illness:  Interval history from 09-05-2018, poor connection on video , camera on the patient's end faces a room's ceiling, no audio, she briefly came into the picture than left the camera again directed to the ceiling. Mute. Frustrating.   Interval history the patient underwent a sleep study on 22 June 2018, she was diagnosed with severe obstructive sleep apnea at an AHI of 33.6 with strong accentuation during REM sleep the REM AHI was 53/h there was no clinically relevant hypoxemia noted and the patient was advised that CPAP would be usually the treatment of choice for this distribution of apneas.  Since she has nasal patency problems and chronic sinusitis which has not responded to nasal sprays alone she had also presented to Dr. Melida Quitter at [facility].  Her encounter there was on 02 September 2018 just yesterday and he reports that she had nasal discharge that is usually white and thick she has not been able to start treatment for sleep apnea.  He lists her current outpatient medication, diagnosed  with chronic sphenoid sinusitis, turbinate hypertrophy and obstructive sleep apnea and discussed the previous CT sinus findings.  He recommended centennial to me that this may not improve nasal patency at all.  For this reason he also recommended a turbinate reduction.  He wanted to initiate CPAP therapy prior to surgery.  She will get in contact with him once we have started CPAP therapy and I will order auto titration now from my end.  Unfortunately I have been very great difficulties connecting with the patient.  My nurse had done an intake exam over the phone she endorsed the Epworth Sleepiness Scale at 1 point, fatigue severity score was not requested, I will place an order for auto CPAP based on Dr. Redmond Baseman conversation with the patient.  Revisit in 2 months with NP        Interval history Dana Mcgrath has been seen by Dr. Reynaldo Minium her primary care physician on February 19, 2018 visit significant anemia status post acute hospitalization for symptomatic anemia after GI bleed, hemoglobin was 7.6 in the office last Friday. The patient had taken a lot of Goody powders Excedrin Migraine etc. causing the GI bleed most likely.  Erosive gastritis was the diagnosis, she had an upper GI endoscopy and now has recovered somewhat from her anemia she is neither tachycardic nor hypotensive she states.  Our visit and a sleep consultation from 26 February 2018 at concluded with the patient's need for a sleep test.  She states that she no longer has headaches at this time  her hypertension has been well controlled she did not need to start on hypertensive medication in spite of losing blood.  Her vertigo is gone, and she states that she is not excessively daytime sleepy either.  She is using a Flonase spray just before sleep time not every night but most nights.  She also hopes that the CT findings of a fluid collection in the right nausea and may not need surgical treatment if she can improve the sinus drainage.  Dr. Redmond Baseman  ENT.  She had no recurrence of black stools, she tolerates the iron reasonably well by mouth.  She has started on Maxide.     Observations/Objective: Due to the COVID 19 pandemic we are not able to perform in lab sleep studies and the patient will need to have a home sleep test, which she prefers anyway.  She is also still the caretaker of her ailing husband.  She could also be potentially a candidate for a dental device for the inspire technique showed her sleep apnea diagnosis be mild and not REM sleep dependent and not associated with hypoxemia.   Assessment and Plan: I will ask for a home sleep test the patient agreed I explained to her that this will be a disposable test due to the circumstances.  The test kit will be sent to her and will be uploaded on the cloud for which she needs a smart phone or other Wi-Fi access.   Once we find the data have been have been transmitted we will allow the patient to dispose of the whole device.  She is agreeable with this and we will follow-up in (hopefully) 3 to 6 months face-to-face.           The patient reports she is in need of a headache specialist, has sinus problems, ulcers for taking too much medications, previously see by Dr. Domingo Cocking. She has taken a lot of goody powders, Excedrin Migraine, etc.. Dana Mcgrath reports that she had on and off problems with sinus congestion and nasal congestion for years, and that has kept her recently more often from breathing easily at night.  She feels she cannot breathe and she wakes up from it, her husband has also heard her snore and has noticed the intermittent pauses in her breathing. Crescendo snoring.  It is interesting that Dana Mcgrath attributes most of her problems and sleep and headaches to the sinus congestion but has never been seen by an ENT specialist so far.  Sleep habits are as follows: Dinner time is 5-6 PM, and bedtime is as late as 10.30 PM. No TV in the bedroom, cool ,a quiet and dark  environment.  If she uses a nose spray and Tylenol Sinus before bedtime, she can sleep for 4 hours, than she wakes up and is congested again. She feels a neck stiffness and fluid pressure in her nostril, sinus .  She sleeps less well during weather changes, and she has neck pain.  She has 2-3 nocturia.  She will take a second tylenol pill and goes back to sleep - certainly over-user.  total nocturnal sleep time, 6-7 hours.    Sleep medical history and family sleep history: years of sinus headaches and medication overuse. Snoring for several month. Father had HA. Eldest brother has sinus HA.   Social history: married, retired,  Non smoker, non ETOH- caffeine - only in  Excedrine or similar. One daughter, 33.  One grandson 69. Retired from Insurance underwriter / Scientist, product/process development.   Review of  Systems: Out of a complete 14 system review, the patient complains of only the following symptoms, and all other reviewed systems are negative. Uses  imitrex, generic, but has a limited supply.  HA pain relieve OTC. Goody powders.   Epworth score  2 , Fatigue severity score 9  , depression score 0/ 15    Social History   Socioeconomic History   Marital status: Married    Spouse name: Not on file   Number of children: Not on file   Years of education: Not on file   Highest education level: Not on file  Occupational History   Not on file  Social Needs   Financial resource strain: Not on file   Food insecurity    Worry: Not on file    Inability: Not on file   Transportation needs    Medical: Not on file    Non-medical: Not on file  Tobacco Use   Smoking status: Never Smoker   Smokeless tobacco: Never Used  Substance and Sexual Activity   Alcohol use: Yes    Comment: occasional    Drug use: Never   Sexual activity: Not on file  Lifestyle   Physical activity    Days per week: Not on file    Minutes per session: Not on file   Stress: Not on file  Relationships   Social connections     Talks on phone: Not on file    Gets together: Not on file    Attends religious service: Not on file    Active member of club or organization: Not on file    Attends meetings of clubs or organizations: Not on file    Relationship status: Not on file   Intimate partner violence    Fear of current or ex partner: Not on file    Emotionally abused: Not on file    Physically abused: Not on file    Forced sexual activity: Not on file  Other Topics Concern   Not on file  Social History Narrative   Not on file    Family History  Problem Relation Age of Onset   Cancer Mother    Diabetes Mother    Hypertension Mother    Esophageal cancer Mother    Cancer Father    Hyperlipidemia Father    Hypertension Father    Varicose Veins Father    Colon polyps Brother    Breast cancer Neg Hx    Colon cancer Neg Hx    Inflammatory bowel disease Neg Hx    Liver disease Neg Hx    Pancreatic cancer Neg Hx    Rectal cancer Neg Hx    Stomach cancer Neg Hx     Past Medical History:  Diagnosis Date   Allergy    Anemia    Blood transfusion without reported diagnosis    Cataract    Cervical pain (neck)    Headache    HLD (hyperlipidemia)    Hypertension    PONV (postoperative nausea and vomiting)    Sinus disease    Varicose veins     Past Surgical History:  Procedure Laterality Date   ABDOMINAL HYSTERECTOMY     partial   BIOPSY  02/14/2018   Procedure: BIOPSY;  Surgeon: Irving Copas., MD;  Location: [facility];  Service: Gastroenterology;;   BREAST REDUCTION SURGERY  2001   COLONOSCOPY     ESOPHAGOGASTRODUODENOSCOPY N/A 02/14/2018   Procedure: ESOPHAGOGASTRODUODENOSCOPY (EGD);  Surgeon: Irving Copas., MD;  Location: [facility];  Service: Gastroenterology;  Laterality: N/A;   REDUCTION MAMMAPLASTY Bilateral 2002   ROTATOR CUFF REPAIR Right    UPPER GASTROINTESTINAL ENDOSCOPY      Current Outpatient Medications    Medication Sig Dispense Refill   acetaminophen (TYLENOL) 325 MG tablet Take 2 tablets (650 mg total) by mouth every 6 (six) hours as needed for mild pain (or Fever >/= 101). 30 tablet 0   ALOE VERA CONCENTRATE PO Take by mouth.     Ascorbic Acid (VITAMIN C PO) Take by mouth daily.     Biotin w/ Vitamins C & E (HAIR/SKIN/NAILS PO) Take 1 tablet by mouth daily.     Cholecalciferol (VITAMIN D-3 PO) Take by mouth daily.     Cyanocobalamin (VITAMIN B 12 PO) Take by mouth daily.     ferrous sulfate 325 (65 FE) MG tablet Take 325 mg by mouth daily with breakfast.     fluticasone (FLONASE) 50 MCG/ACT nasal spray Place 2 sprays into both nostrils daily.   11   Hyaluronic Acid 20-60 MG CAPS Take by mouth daily.     Multiple Vitamins-Minerals (PRESERVISION AREDS 2 PO) Take by mouth daily.     olmesartan (BENICAR) 20 MG tablet Take 20 mg by mouth daily.      omeprazole (PRILOSEC) 40 MG capsule Take 40 mg by mouth daily.     polyethylene glycol (MIRALAX / GLYCOLAX) packet Take 17 g by mouth daily.     Psyllium (METAMUCIL FIBER PO) Take by mouth.     triamcinolone (NASACORT) 55 MCG/ACT AERO nasal inhaler Place 2 sprays into the nose daily. 1 Inhaler 12   No current facility-administered medications for this visit.     Allergies as of 09/03/2018 - Review Complete 09/02/2018  Allergen Reaction Noted   Aspirin Other (See Comments) 03/03/2018   Codeine Nausea And Vomiting 11/25/2014   Other  05/26/2018   Prednisone Palpitations 11/25/2014    Vitals: There were no vitals taken for this visit. Last Weight:  Wt Readings from Last 1 Encounters:  06/04/18 169 lb (76.7 kg)   NUU:VOZDG is no height or weight on file to calculate BMI.     Last Height:   Ht Readings from Last 1 Encounters:  06/04/18 '5\' 4"'$  (1.626 m)     Assessment - patient need of CPAP but also ENT surgery. quoted above.   Follow Up Instructions:    patient will start on CPAP and order was issued , she wants  [facility] in [place] to be her DME.   she will follow with Dr. Redmond Baseman ENT in 1-2 month, I will follow after surgery.    I discussed the assessment and treatment plan with the patient. The patient was provided an opportunity to ask questions and all were answered. The patient agreed with the plan and demonstrated an understanding of the instructions. We also discussed the COVID 19 guidelines and recommendation of patient's of her age and her husband's age.    The patient was advised to call back or seek an in-person evaluation if the symptoms worsen or if the condition fails to improve as anticipated.  I provided 13 minutes of non-face-to-face time during this encounter.   Larey Seat, MD   Larey Seat, MD 6/44/0347, 42:59 AM  Certified in Neurology by [employer]BP[employer] Certified in Sleep Medicine by [facility] [address] [place], [place] [address]

## 2018-09-03 NOTE — Patient Instructions (Signed)
You will start on CPAP before Dr Redmond Baseman will schedule your surgery, we then follow up- after surgery at [facility].  Patient will start on CPAP and order was issued , she wants [facility] in [place] to be her DME.  She will follow with Dr. Redmond Baseman ENT in 1-2 month, I will follow after surgery.

## 2018-09-04 ENCOUNTER — Telehealth: Payer: Self-pay | Admitting: Gastroenterology

## 2018-09-04 NOTE — Telephone Encounter (Signed)
Patient called in wanting to know if dr.mansouraty will up the dosage of the medication acetaminophen (TYLENOL) 325 MG tablet [[id number]]  . I did advised that a dr.Fang Xu had prescribe but she stated that no she never seen the doctor and that dr.mansouraty did. I told patient I will send message to nurse to give call. Please call and advise.

## 2018-09-07 ENCOUNTER — Encounter: Payer: Self-pay | Admitting: Neurology

## 2018-09-07 NOTE — Telephone Encounter (Signed)
Pt advised that she needs to speak with PCP if Tylenol is not helping or should she feel the need to increase dose. Pt states that she understands and will call her PCP office.

## 2018-09-10 DIAGNOSIS — H5213 Myopia, bilateral: Secondary | ICD-10-CM | POA: Diagnosis not present

## 2018-09-10 DIAGNOSIS — Z0100 Encounter for examination of eyes and vision without abnormal findings: Secondary | ICD-10-CM | POA: Diagnosis not present

## 2018-09-14 ENCOUNTER — Telehealth: Payer: Self-pay | Admitting: Gastroenterology

## 2018-09-14 NOTE — Telephone Encounter (Signed)
Patient was advised at her last visit that she should have labs done before having a EGD and if her labs were good she would not need a EGD. She would like to know when can she come in for her labs

## 2018-09-15 ENCOUNTER — Telehealth: Payer: Self-pay

## 2018-09-15 DIAGNOSIS — J0100 Acute maxillary sinusitis, unspecified: Secondary | ICD-10-CM | POA: Diagnosis not present

## 2018-09-15 DIAGNOSIS — Z20828 Contact with and (suspected) exposure to other viral communicable diseases: Secondary | ICD-10-CM | POA: Diagnosis not present

## 2018-09-15 DIAGNOSIS — Z20818 Contact with and (suspected) exposure to other bacterial communicable diseases: Secondary | ICD-10-CM | POA: Diagnosis not present

## 2018-09-15 DIAGNOSIS — Z20822 Contact with and (suspected) exposure to covid-19: Secondary | ICD-10-CM

## 2018-09-15 NOTE — Telephone Encounter (Signed)
Claiborne Billings with Dr. Reynaldo Minium, [facility], requests COVID 19 test for exposure. Message left to call back and schedule testing. Order placed. Practice # [phone] Fax [phone]

## 2018-09-22 ENCOUNTER — Ambulatory Visit: Payer: Medicare HMO | Admitting: Neurology

## 2018-09-25 DIAGNOSIS — U071 COVID-19: Secondary | ICD-10-CM | POA: Diagnosis not present

## 2018-10-05 NOTE — Telephone Encounter (Signed)
See letter from last procedure. Pt was advised of those recommendations. Pt states that she will call back at a later time to schedule procedure once she has her transportation set-up.

## 2018-10-06 DIAGNOSIS — R69 Illness, unspecified: Secondary | ICD-10-CM | POA: Diagnosis not present

## 2018-10-06 DIAGNOSIS — I10 Essential (primary) hypertension: Secondary | ICD-10-CM | POA: Diagnosis not present

## 2018-10-26 DIAGNOSIS — I10 Essential (primary) hypertension: Secondary | ICD-10-CM | POA: Diagnosis not present

## 2018-10-26 DIAGNOSIS — U071 COVID-19: Secondary | ICD-10-CM | POA: Diagnosis not present

## 2018-11-11 ENCOUNTER — Other Ambulatory Visit: Payer: Self-pay | Admitting: Internal Medicine

## 2018-11-11 ENCOUNTER — Other Ambulatory Visit: Payer: Self-pay

## 2018-11-11 ENCOUNTER — Ambulatory Visit
Admission: RE | Admit: 2018-11-11 | Discharge: 2018-11-11 | Disposition: A | Discharge status: Home / Self Care | Payer: Medicare HMO | Source: Ambulatory Visit | Attending: Internal Medicine | Admitting: Internal Medicine

## 2018-11-11 DIAGNOSIS — M47814 Spondylosis without myelopathy or radiculopathy, thoracic region: Secondary | ICD-10-CM | POA: Diagnosis not present

## 2018-11-11 DIAGNOSIS — U071 COVID-19: Secondary | ICD-10-CM

## 2018-11-12 ENCOUNTER — Telehealth: Payer: Self-pay | Admitting: *Deleted

## 2018-11-12 NOTE — Telephone Encounter (Signed)
Received fax from [employer] stating she needs face to face for insurance coverage of CPAP supplies. Called patient and scheduled her to FU next Tues with NP. She understands to arrive 30 min early, wear a mask. She verbalized understanding, appreciation.

## 2018-11-17 ENCOUNTER — Other Ambulatory Visit: Payer: Self-pay

## 2018-11-17 ENCOUNTER — Telehealth: Payer: Self-pay

## 2018-11-17 ENCOUNTER — Encounter: Payer: Self-pay | Admitting: Adult Health

## 2018-11-17 ENCOUNTER — Ambulatory Visit (INDEPENDENT_AMBULATORY_CARE_PROVIDER_SITE_OTHER): Payer: Medicare HMO | Admitting: Adult Health

## 2018-11-17 VITALS — BP 132/82 | HR 81 | Temp 97.8°F | Ht 64.5 in | Wt 161.4 lb

## 2018-11-17 DIAGNOSIS — G4733 Obstructive sleep apnea (adult) (pediatric): Secondary | ICD-10-CM | POA: Diagnosis not present

## 2018-11-17 NOTE — Patient Instructions (Addendum)
Your Plan:  Continue current settings of your CPAP machine and call office in 1 month to pull download report to ensure adequate management as you are unable to use during the month of August  Contact your DME company in regards to adequately cleaning your machine after COVID-19 infection along with obtaining additional supplies    Follow-up in 4 months with Amy, NP or Jinny Blossom, NP or call earlier if needed       Thank you for coming to see [place] at [facility]. I hope we have been able to provide you high quality care today.  You may receive a patient satisfaction survey over the next few weeks. We would appreciate your feedback and comments so that we may continue to improve ourselves and the health of our patients.

## 2018-11-17 NOTE — Progress Notes (Signed)
PATIENT: Dana Mcgrath DOB: Aug 15, 1945  REASON FOR VISIT: CPAP follow up HISTORY FROM: patient  HISTORY OF PRESENT ILLNESS:  Update 11/17/2018: [name] is a 73 year old female who is being seen today for initial CPAP compliance visit. CPAP compliance report from 09/16/2018-10/16/2018 with 30 out of 31 usage days with 26 days greater than 4 hours showing 84% compliance.  Average usage 5 hours and 55 minutes with residual AHI 2.4.  Leaks in the 95th percentile 20.  Pressure in the 95th percentile of 8.6 on min pressure 5 and max pressure 12 with EPR level 3.  She was diagnosed with COVID-19 coronavirus on 09/29/2018 after presenting to urgent care on 09/25/2018 with symptoms and exposure from her husband who was tested positive.  She was able to continue use of CPAP for an additional 1-2 weeks but started having difficulty with sinus drainage causing difficulty tolerating CPAP.  She did trial use of CPAP intermittently throughout August but unable to tolerate.  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  She had only recently discontinued steroids approximately 1 week ago and feels as though she has slowly returning to her baseline.  She has since recently restarted use of CPAP and has since changed supplies.  She continues to follow with [facility] for needed supplies.  Epworth sleepiness scale 1 which was comparable to prior visit.  She does endorse recent weight loss with today's weight 161 pounds (prior 168 pounds).  No further concerns at this time.      Interval history by Dr. Vickey Mcgrath from prior visits for reference purposes only:  The patient underwent a sleep study on 22 June 2018, she was diagnosed with severe obstructive sleep apnea at an AHI of 33.6 with strong accentuation during REM sleep the REM AHI was 53/h there was no  clinically relevant hypoxemia noted and the patient was advised that CPAP would be usually the treatment of choice for this distribution of apneas.  Since she has nasal patency problems and chronic sinusitis which has not responded to nasal sprays alone she had also presented to Dr. Christia Readingwight [facility]gr[facility] at [facility].  Her encounter there was on 02 September 2018 just yesterday and he reports that she had nasal discharge that is usually white and thick she has not been able to start treatment for sleep apnea.  He lists her current outpatient medication, diagnosed with chronic sphenoid sinusitis, turbinate hypertrophy and obstructive sleep apnea and discussed the previous CT sinus findings.  He recommended [facility] to me that this may not improve nasal patency at all.  For this reason he also recommended a turbinate reduction.  He wanted to initiate CPAP therapy prior to surgery.  She will get in contact with him once we have started CPAP therapy and I will order auto titration now from my end.  Unfortunately I have been very great difficulties connecting with the patient.  My nurse had done an intake exam over the phone she endorsed the Epworth Sleepiness Scale at 1 point, fatigue severity score was not requested, I will place an order for auto CPAP based on Dr. Jenne Mcgrath conversation with the patient.  Revisit in 2 months with NP     REVIEW OF SYSTEMS: Out of a complete 14 system review of symptoms, the patient complains only of the following symptoms, and all other reviewed systems are  negative. Headache -residual from steroids     ALLERGIES: Allergies  Allergen Reactions  . Aspirin Other (See Comments)    Bleeding stomach ulcers  . Codeine Nausea And Vomiting  . Other     Pholcodine causes nausea and vomiting  . Prednisone Palpitations    HOME MEDICATIONS: Outpatient Medications Prior to Visit  Medication Sig Dispense Refill  . acetaminophen (TYLENOL) 325 MG tablet Take 2 tablets  (650 mg total) by mouth every 6 (six) hours as needed for mild pain (or Fever >/= 101). 30 tablet 0  . ALOE VERA CONCENTRATE PO Take by mouth.    . Ascorbic Acid (VITAMIN C PO) Take by mouth daily.    . Biotin w/ Vitamins C & E (HAIR/SKIN/NAILS PO) Take 1 tablet by mouth daily.    . Cholecalciferol (VITAMIN D-3 PO) Take by mouth daily.    . Cyanocobalamin (VITAMIN B 12 PO) Take by mouth daily.    . fluticasone (FLONASE) 50 MCG/ACT nasal spray Place 2 sprays into both nostrils daily.   11  . Hyaluronic Acid 20-60 MG CAPS Take by mouth daily.    . Multiple Vitamins-Minerals (PRESERVISION AREDS 2 PO) Take by mouth daily.    Marland Kitchen olmesartan (BENICAR) 20 MG tablet Take 10 mg by mouth daily.     Marland Kitchen omeprazole (PRILOSEC) 40 MG capsule Take 40 mg by mouth daily.    . polyethylene glycol (MIRALAX / GLYCOLAX) packet Take 17 g by mouth daily.    . Psyllium (METAMUCIL FIBER PO) Take by mouth.    . triamcinolone (NASACORT) 55 MCG/ACT AERO nasal inhaler Place 2 sprays into the nose daily. 1 Inhaler 12  . ferrous sulfate 325 (65 FE) MG tablet Take 325 mg by mouth daily with breakfast.     No facility-administered medications prior to visit.     PAST MEDICAL HISTORY: Past Medical History:  Diagnosis Date  . Allergy   . Anemia   . Blood transfusion without reported diagnosis   . Cataract   . Cervical pain (neck)   . Headache   . HLD (hyperlipidemia)   . Hypertension   . PONV (postoperative nausea and vomiting)   . Sinus disease   . Varicose veins     PAST SURGICAL HISTORY: Past Surgical History:  Procedure Laterality Date  . ABDOMINAL HYSTERECTOMY     partial  . BIOPSY  02/14/2018   Procedure: BIOPSY;  Surgeon: Dana Mcgrath., MD;  Location: [facility];  Service: Gastroenterology;;  . BREAST REDUCTION SURGERY  2001  . COLONOSCOPY    . ESOPHAGOGASTRODUODENOSCOPY N/A 02/14/2018   Procedure: ESOPHAGOGASTRODUODENOSCOPY (EGD);  Surgeon: Dana Mcgrath., MD;  Location: [facility];  Service: Gastroenterology;  Laterality: N/A;  . REDUCTION MAMMAPLASTY Bilateral 2002  . ROTATOR CUFF REPAIR Right   . UPPER GASTROINTESTINAL ENDOSCOPY      FAMILY HISTORY: Family History  Problem Relation Age of Onset  . Cancer Mother   . Diabetes Mother   . Hypertension Mother   . Esophageal cancer Mother   . Cancer Father   . Hyperlipidemia Father   . Hypertension Father   . Varicose Veins Father   . Colon polyps Brother   . Breast cancer Neg Hx   . Colon cancer Neg Hx   . Inflammatory bowel disease Neg Hx   . Liver disease Neg Hx   . Pancreatic cancer Neg Hx   . Rectal cancer Neg Hx   . Stomach cancer Neg Hx     SOCIAL HISTORY:  Social History   Socioeconomic History  . Marital status: Married    Spouse name: Not on file  . Number of children: Not on file  . Years of education: Not on file  . Highest education level: Not on file  Occupational History  . Not on file  Social Needs  . Financial resource strain: Not on file  . Food insecurity    Worry: Not on file    Inability: Not on file  . Transportation needs    Medical: Not on file    Non-medical: Not on file  Tobacco Use  . Smoking status: Never Smoker  . Smokeless tobacco: Never Used  Substance and Sexual Activity  . Alcohol use: Yes    Comment: occasional   . Drug use: Never  . Sexual activity: Not on file  Lifestyle  . Physical activity    Days per week: Not on file    Minutes per session: Not on file  . Stress: Not on file  Relationships  . Social Herbalist on phone: Not on file    Gets together: Not on file    Attends religious service: Not on file    Active member of club or organization: Not on file    Attends meetings of clubs or organizations: Not on file    Relationship status: Not on file  . Intimate partner violence    Fear of current or ex partner: Not on file    Emotionally abused: Not on file    Physically abused: Not on file    Forced sexual activity:  Not on file  Other Topics Concern  . Not on file  Social History Narrative  . Not on file      PHYSICAL EXAM  Vitals:   11/17/18 1334  BP: 132/82  Pulse: 81  Temp: 97.8 F (36.6 C)  TempSrc: Oral  Weight: 161 lb 6.4 oz (73.2 kg)  Height: 5' 4.5" (1.638 m)   Body mass index is 27.28 kg/m.  General: well developed, well nourished,  pleasant elderly Caucasian female, seated, in no evident distress Head: head normocephalic and atraumatic.   Neck: supple with no carotid or supraclavicular bruits Cardiovascular: regular rate and rhythm, no murmurs Musculoskeletal: no deformity Skin:  no rash/petichiae Vascular:  Normal pulses all extremities   Neurologic Exam Mental Status: Awake and fully alert. Oriented to place and time. Recent and remote memory intact. Attention span, concentration and fund of knowledge appropriate. Mood and affect appropriate.  Cranial Nerves: Pupils equal, briskly reactive to light. Extraocular movements full without nystagmus. Visual fields full to confrontation. Hearing intact. Facial sensation intact. Face, tongue, palate moves normally and symmetrically.  Motor: Normal bulk and tone. Normal strength in all tested extremity muscles. Sensory.: intact to touch , pinprick , position and vibratory sensation.  Coordination: Rapid alternating movements normal in all extremities. Finger-to-nose and heel-to-shin performed accurately bilaterally. Gait and Station: Arises from chair without difficulty. Stance is normal. Gait demonstrates normal stride length and balance Reflexes: 1+ and symmetric. Toes downgoing.     DIAGNOSTIC DATA (LABS, IMAGING, TESTING) - I reviewed patient records, labs, notes, testing and imaging myself where available.  Lab Results  Component Value Date   WBC 6.9 04/01/2018   HGB 14.4 04/01/2018   HCT 42.4 04/01/2018   MCV 92.3 04/01/2018   PLT 264.0 04/01/2018      Component Value Date/Time   NA 141 02/14/2018 0550   K 4.0  02/14/2018 0550  CL 109 02/14/2018 0550   CO2 24 02/14/2018 0550   GLUCOSE 105 (H) 02/14/2018 0550   BUN 14 02/14/2018 0550   CREATININE 0.63 02/14/2018 0550   CALCIUM 8.2 (L) 02/14/2018 0550   PROT 6.2 (L) 02/13/2018 1306   ALBUMIN 3.8 02/13/2018 1306   AST 25 02/13/2018 1306   ALT 19 02/13/2018 1306   ALKPHOS 56 02/13/2018 1306   BILITOT 0.6 02/13/2018 1306   GFRNONAA >60 02/14/2018 0550   GFRAA >60 02/14/2018 0550   No results found for: CHOL, HDL, LDLCALC, LDLDIRECT, TRIG, CHOLHDL No results found for: [id number] Lab Results  Component Value Date   VITAMINB12 >1500 (H) 04/01/2018   No results found for: TSH    ASSESSMENT AND PLAN 73 y.o. year old female  has a past medical history of Allergy, Anemia, Blood transfusion without reported diagnosis, Cataract, Cervical pain (neck), Headache, HLD (hyperlipidemia), Hypertension, PONV (postoperative nausea and vomiting), Sinus disease, and Varicose veins. here with initial CPAP compliance visit  She did very well in the month of July but due to contracting COVID-19 virus and complications from medications to alleviate symptoms, she had difficulty tolerating CPAP use.  Recently returning to baseline and has been able to tolerate CPAP.  When using, optimal residual AHI therefore recommend continuation of current levels without setting changes.  She was requested to call office in 1 month to pull download report to ensure adequate management with restarting CPAP.  She was also advised to contact her DME company for additional supplies along with information regarding appropriate cleaning techniques and equipment changes if needed since contracting COVID-19.  She will follow-up in 4 months with Aundra MilletMegan, NP or Amy, NP or call earlier if needed  Greater than 50% of this 15-minute visit was spent discussing CPAP compliance download, recent symptoms of COVID-19 and difficulty tolerating CPAP and importance of restarting adequate compliance for  overall health reasons and insurance purposes.  Answered all questions to patient satisfaction.    Ihor AustinJessica McCue [facility] MSN, [facility]NP-BC [facility] [address], [address] [place], [place] [id number] [phone]

## 2018-11-17 NOTE — Telephone Encounter (Signed)
CPAP orders along with todays office note have been faxed to DME: [facility] P: [phone] F: [phone]  Confirmation fax has been received

## 2018-11-27 DIAGNOSIS — Z23 Encounter for immunization: Secondary | ICD-10-CM | POA: Diagnosis not present

## 2018-12-09 DIAGNOSIS — M25551 Pain in right hip: Secondary | ICD-10-CM | POA: Diagnosis not present

## 2018-12-09 DIAGNOSIS — N63 Unspecified lump in unspecified breast: Secondary | ICD-10-CM | POA: Diagnosis not present

## 2018-12-09 DIAGNOSIS — N644 Mastodynia: Secondary | ICD-10-CM | POA: Diagnosis not present

## 2018-12-09 DIAGNOSIS — I10 Essential (primary) hypertension: Secondary | ICD-10-CM | POA: Diagnosis not present

## 2018-12-09 DIAGNOSIS — M8580 Other specified disorders of bone density and structure, unspecified site: Secondary | ICD-10-CM | POA: Diagnosis not present

## 2018-12-10 DIAGNOSIS — M25511 Pain in right shoulder: Secondary | ICD-10-CM | POA: Diagnosis not present

## 2018-12-10 DIAGNOSIS — M79631 Pain in right forearm: Secondary | ICD-10-CM | POA: Diagnosis not present

## 2018-12-11 ENCOUNTER — Other Ambulatory Visit: Payer: Self-pay | Admitting: Internal Medicine

## 2018-12-11 DIAGNOSIS — N644 Mastodynia: Secondary | ICD-10-CM

## 2018-12-11 DIAGNOSIS — N6310 Unspecified lump in the right breast, unspecified quadrant: Secondary | ICD-10-CM

## 2018-12-18 ENCOUNTER — Other Ambulatory Visit: Payer: Self-pay

## 2018-12-18 ENCOUNTER — Ambulatory Visit
Admission: RE | Admit: 2018-12-18 | Discharge: 2018-12-18 | Disposition: A | Discharge status: Home / Self Care | Payer: Medicare HMO | Source: Ambulatory Visit | Attending: Internal Medicine | Admitting: Internal Medicine

## 2018-12-18 DIAGNOSIS — N644 Mastodynia: Secondary | ICD-10-CM

## 2018-12-18 DIAGNOSIS — N6310 Unspecified lump in the right breast, unspecified quadrant: Secondary | ICD-10-CM

## 2018-12-18 DIAGNOSIS — R928 Other abnormal and inconclusive findings on diagnostic imaging of breast: Secondary | ICD-10-CM | POA: Diagnosis not present

## 2018-12-23 ENCOUNTER — Telehealth: Payer: Self-pay | Admitting: Gastroenterology

## 2018-12-23 DIAGNOSIS — M25551 Pain in right hip: Secondary | ICD-10-CM | POA: Diagnosis not present

## 2018-12-24 NOTE — Telephone Encounter (Signed)
Left message for pt to call back  °

## 2018-12-28 DIAGNOSIS — M25551 Pain in right hip: Secondary | ICD-10-CM | POA: Diagnosis not present

## 2018-12-28 NOTE — Telephone Encounter (Signed)
Left message on machine to call back will await further communication from the pt  

## 2019-01-04 NOTE — Telephone Encounter (Signed)
Left message on machine to call back  

## 2019-01-04 NOTE — Telephone Encounter (Signed)
Pt stated that she has a new phone number (now updated) and is returning call.

## 2019-01-05 NOTE — Telephone Encounter (Signed)
OK for patient to undergo repeat Iron/TIBC/Ferritin/CBC. If these are OK then OK to hold on procedures. Let's see her back in clinic once these have been completed. Thanks. GM

## 2019-01-05 NOTE — Telephone Encounter (Signed)
The pt would like to know if she can have repeat iron studies prior to having EGD.  She would like to avoid any further procedures at this time.  She and her husband went thru [facility] and she is just not up to having anything further at this time.

## 2019-01-06 ENCOUNTER — Other Ambulatory Visit: Payer: Self-pay

## 2019-01-06 DIAGNOSIS — D509 Iron deficiency anemia, unspecified: Secondary | ICD-10-CM

## 2019-01-06 NOTE — Telephone Encounter (Signed)
The pt has been advised and lab order in [place].  She will come in at her convenience.

## 2019-01-10 DIAGNOSIS — G4733 Obstructive sleep apnea (adult) (pediatric): Secondary | ICD-10-CM | POA: Diagnosis not present

## 2019-01-12 DIAGNOSIS — G4733 Obstructive sleep apnea (adult) (pediatric): Secondary | ICD-10-CM | POA: Diagnosis not present

## 2019-01-13 ENCOUNTER — Other Ambulatory Visit (INDEPENDENT_AMBULATORY_CARE_PROVIDER_SITE_OTHER): Payer: Medicare HMO

## 2019-01-13 DIAGNOSIS — D509 Iron deficiency anemia, unspecified: Secondary | ICD-10-CM | POA: Diagnosis not present

## 2019-01-13 LAB — CBC WITH DIFFERENTIAL/PLATELET
Basophils Absolute: 0.1 10*3/uL (ref 0.0–0.1)
Basophils Relative: 0.8 % (ref 0.0–3.0)
Eosinophils Absolute: 0.2 10*3/uL (ref 0.0–0.7)
Eosinophils Relative: 3.1 % (ref 0.0–5.0)
HCT: 40.4 % (ref 36.0–46.0)
Hemoglobin: 13.5 g/dL (ref 12.0–15.0)
Lymphocytes Relative: 32.4 % (ref 12.0–46.0)
Lymphs Abs: 2.2 10*3/uL (ref 0.7–4.0)
MCHC: 33.5 g/dL (ref 30.0–36.0)
MCV: 95.7 fl (ref 78.0–100.0)
Monocytes Absolute: 0.7 10*3/uL (ref 0.1–1.0)
Monocytes Relative: 10 % (ref 3.0–12.0)
Neutro Abs: 3.7 10*3/uL (ref 1.4–7.7)
Neutrophils Relative %: 53.7 % (ref 43.0–77.0)
Platelets: 277 10*3/uL (ref 150.0–400.0)
RBC: 4.23 Mil/uL (ref 3.87–5.11)
RDW: 13.2 % (ref 11.5–15.5)
WBC: 6.9 10*3/uL (ref 4.0–10.5)

## 2019-01-14 ENCOUNTER — Other Ambulatory Visit: Payer: Self-pay

## 2019-01-14 DIAGNOSIS — D509 Iron deficiency anemia, unspecified: Secondary | ICD-10-CM

## 2019-01-14 LAB — IRON,TIBC AND FERRITIN PANEL
%SAT: 34 % (calc) (ref 16–45)
Ferritin: 50 ng/mL (ref 16–288)
Iron: 103 ug/dL (ref 45–160)
TIBC: 304 mcg/dL (calc) (ref 250–450)

## 2019-01-18 DIAGNOSIS — Z23 Encounter for immunization: Secondary | ICD-10-CM | POA: Diagnosis not present

## 2019-01-27 DIAGNOSIS — L658 Other specified nonscarring hair loss: Secondary | ICD-10-CM | POA: Diagnosis not present

## 2019-01-27 DIAGNOSIS — I872 Venous insufficiency (chronic) (peripheral): Secondary | ICD-10-CM | POA: Diagnosis not present

## 2019-02-10 DIAGNOSIS — G4733 Obstructive sleep apnea (adult) (pediatric): Secondary | ICD-10-CM | POA: Diagnosis not present

## 2019-02-16 DIAGNOSIS — M5136 Other intervertebral disc degeneration, lumbar region: Secondary | ICD-10-CM | POA: Diagnosis not present

## 2019-02-16 DIAGNOSIS — R69 Illness, unspecified: Secondary | ICD-10-CM | POA: Diagnosis not present

## 2019-02-16 DIAGNOSIS — D7589 Other specified diseases of blood and blood-forming organs: Secondary | ICD-10-CM | POA: Diagnosis not present

## 2019-02-18 DIAGNOSIS — G4733 Obstructive sleep apnea (adult) (pediatric): Secondary | ICD-10-CM | POA: Diagnosis not present

## 2019-03-12 DIAGNOSIS — G4733 Obstructive sleep apnea (adult) (pediatric): Secondary | ICD-10-CM | POA: Diagnosis not present

## 2019-03-25 ENCOUNTER — Ambulatory Visit (INDEPENDENT_AMBULATORY_CARE_PROVIDER_SITE_OTHER): Payer: Medicare Other | Admitting: Family Medicine

## 2019-03-25 ENCOUNTER — Encounter: Payer: Self-pay | Admitting: Family Medicine

## 2019-03-25 ENCOUNTER — Other Ambulatory Visit: Payer: Self-pay

## 2019-03-25 VITALS — BP 121/73 | HR 71 | Temp 97.9°F | Ht 64.0 in | Wt 161.4 lb

## 2019-03-25 DIAGNOSIS — G4733 Obstructive sleep apnea (adult) (pediatric): Secondary | ICD-10-CM | POA: Diagnosis not present

## 2019-03-25 DIAGNOSIS — Z9989 Dependence on other enabling machines and devices: Secondary | ICD-10-CM | POA: Insufficient documentation

## 2019-03-25 NOTE — Patient Instructions (Signed)
Continue CPAP nightly and greater than 4 hours each night   Follow up in 3 months, sooner if needed   Sleep Apnea Sleep apnea affects breathing during sleep. It causes breathing to stop for a short time or to become shallow. It can also increase the risk of:  Heart attack.  Stroke.  Being very overweight (obese).  Diabetes.  Heart failure.  Irregular heartbeat. The goal of treatment is to help you breathe normally again. What are the causes? There are three kinds of sleep apnea:  Obstructive sleep apnea. This is caused by a blocked or collapsed airway.  Central sleep apnea. This happens when the brain does not send the right signals to the muscles that control breathing.  Mixed sleep apnea. This is a combination of obstructive and central sleep apnea. The most common cause of this condition is a collapsed or blocked airway. This can happen if:  Your throat muscles are too relaxed.  Your tongue and tonsils are too large.  You are overweight.  Your airway is too small. What increases the risk?  Being overweight.  Smoking.  Having a small airway.  Being older.  Being female.  Drinking alcohol.  Taking medicines to calm yourself (sedatives or tranquilizers).  Having family members with the condition. What are the signs or symptoms?  Trouble staying asleep.  Being sleepy or tired during the day.  Getting angry a lot.  Loud snoring.  Headaches in the morning.  Not being able to focus your mind (concentrate).  Forgetting things.  Less interest in sex.  Mood swings.  Personality changes.  Feelings of sadness (depression).  Waking up a lot during the night to pee (urinate).  Dry mouth.  Sore throat. How is this diagnosed?  Your medical history.  A physical exam.  A test that is done when you are sleeping (sleep study). The test is most often done in a sleep lab but may also be done at home. How is this treated?   Sleeping on your  side.  Using a medicine to get rid of mucus in your nose (decongestant).  Avoiding the use of alcohol, medicines to help you relax, or certain pain medicines (narcotics).  Losing weight, if needed.  Changing your diet.  Not smoking.  Using a machine to open your airway while you sleep, such as: ? An oral appliance. This is a mouthpiece that shifts your lower jaw forward. ? A CPAP device. This device blows air through a mask when you breathe out (exhale). ? An EPAP device. This has valves that you put in each nostril. ? A BPAP device. This device blows air through a mask when you breathe in (inhale) and breathe out.  Having surgery if other treatments do not work. It is important to get treatment for sleep apnea. Without treatment, it can lead to:  High blood pressure.  Coronary artery disease.  In men, not being able to have an erection (impotence).  Reduced thinking ability. Follow these instructions at home: Lifestyle  Make changes that your doctor recommends.  Eat a healthy diet.  Lose weight if needed.  Avoid alcohol, medicines to help you relax, and some pain medicines.  Do not use any products that contain nicotine or tobacco, such as cigarettes, e-cigarettes, and chewing tobacco. If you need help quitting, ask your doctor. General instructions  Take over-the-counter and prescription medicines only as told by your doctor.  If you were given a machine to use while you sleep, use it only  as told by your doctor.  If you are having surgery, make sure to tell your doctor you have sleep apnea. You may need to bring your device with you.  Keep all follow-up visits as told by your doctor. This is important. Contact a doctor if:  The machine that you were given to use during sleep bothers you or does not seem to be working.  You do not get better.  You get worse. Get help right away if:  Your chest hurts.  You have trouble breathing in enough air.  You have  an uncomfortable feeling in your back, arms, or stomach.  You have trouble talking.  One side of your body feels weak.  A part of your face is hanging down. These symptoms may be an emergency. Do not wait to see if the symptoms will go away. Get medical help right away. Call your local emergency services (911 in the [place] Do not drive yourself to the hospital. Summary  This condition affects breathing during sleep.  The most common cause is a collapsed or blocked airway.  The goal of treatment is to help you breathe normally while you sleep. This information is not intended to replace advice given to you by your health care provider. Make sure you discuss any questions you have with your health care provider. Document Revised: 12/19/2017 Document Reviewed: 10/28/2017 Elsevier Patient Education  Dana Mcgrath.

## 2019-03-25 NOTE — Progress Notes (Signed)
PATIENT: Dana Mcgrath DOB: [birth year]  REASON FOR VISIT: follow up HISTORY FROM: patient  Chief Complaint  Patient presents with  . Follow-up    RM5. alone. states that the CPAP works well. States that she wakes up sometimes with dry mouth.     HISTORY OF PRESENT ILLNESS: Today 03/25/19 Dana Mcgrath is a 74 y.o. female here today for follow up.  She continues CPAP therapy.  She admits over the past 30 days she has not been as consistent with usage.  States that she has not gotten in the habit of using it every night.  She denies difficulty with her machine.  She is uncertain if she feels any better when using it.  Compliance report dated 02/22/2019 through 03/23/2019 reveals that she used CPAP 16 of the last 30 days for compliance of 53%.  13 of the last 30 days she used CPAP greater than 4 hours.  Average usage on days used was 5 hours and 23 minutes.  Residual AHI was 3.8 on 5 to 12 cm of water and an EPR of 3.  There was a leak noted in the 95th percentile of 22.7.  HISTORY: (copied from Dana Mcgrath's note on 11/17/2018)  Update 11/17/2018: Dana Mcgrath is a 74 year old female who is being seen today for initial CPAP compliance visit. CPAP compliance report from 09/16/2018-10/16/2018 with 30 out of 31 usage days with 26 days greater than 4 hours showing 84% compliance.  Average usage 5 hours and 55 minutes with residual AHI 2.4.  Leaks in the 95th percentile 20.  Pressure in the 95th percentile of 8.6 on min pressure 5 and max pressure 12 with EPR level 3.  She was diagnosed with COVID-19 coronavirus on 09/29/2018 after presenting to urgent care on 09/25/2018 with symptoms and exposure from her husband who was tested positive.  She was able to continue use of CPAP for an additional 1-2 weeks but started having difficulty with sinus drainage causing difficulty tolerating CPAP.  She did trial use of CPAP intermittently throughout August but unable to tolerate.  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  She had only recently discontinued steroids approximately 1 week ago and feels as though she has slowly returning to her baseline.  She has since recently restarted use of CPAP and has since changed supplies.  She continues to follow with Dana Mcgrath patient for needed supplies.  Epworth sleepiness scale 1 which was comparable to prior visit.  She does endorse recent weight loss with today's weight 161 pounds (prior 168 pounds).  No further concerns at this time.   Interval history by Dana Mcgrath from prior visits for reference purposes only:  The patient underwent a sleep study on 22 June 2018, she was diagnosed with severe obstructive sleep apnea at an AHI of 33.6 with strong accentuation during REM sleep the REM AHI was 53/h there was no clinically relevant hypoxemia noted and the patient was advised that CPAP would be usually the treatment of choice for this distribution of apneas. Since she has nasal patency problems and chronic sinusitis which has not responded to nasal sprays alone she had also presented to [facility] Mcgr[facility] at [facility]. Her encounter there was on 02 September 2018 just yesterday and he reports that she had nasal discharge that is usually white and thick she has not  been able to start treatment for sleep apnea. He lists her current outpatient medication, diagnosed with chronic sphenoid sinusitis, turbinate hypertrophy and obstructive sleep apnea and discussed the previous CT sinus findings. He recommended centennial to me that this may not improve nasal patency at all. For this reason he also recommended a turbinate reduction. He wanted to initiate CPAP therapy prior to surgery. She will get in contact with him once we have started CPAP therapy and I will order auto titration now from my end.  Unfortunately I have been very great difficulties connecting with the patient. My nurse had done an intake exam over the phone she endorsed the Epworth Sleepiness Scale at 1 point, fatigue severity score was not requested, I will place an order for auto CPAP based on Dana Mcgrath conversation with the patient. Revisit in 2 months with NP    REVIEW OF SYSTEMS: Out of a complete 14 system review of symptoms, the patient complains only of the following symptoms, none and all other reviewed systems are negative.  Epworth sleepiness scale: 0 Fatigue severity scale: 7  ALLERGIES: Allergies  Allergen Reactions  . Aspirin Other (See Comments)    Bleeding stomach ulcers  . Codeine Nausea And Vomiting  . Dexamethasone   . Other     Pholcodine causes nausea and vomiting  . Prednisone Palpitations    Mcgrath MEDICATIONS: Outpatient Medications Prior to Visit  Medication Sig Dispense Refill  . acetaminophen (TYLENOL) 325 MG tablet Take 2 tablets (650 mg total) by mouth every 6 (six) hours as needed for mild pain (or Fever >/= 101). 30 tablet 0  . ALOE VERA CONCENTRATE PO Take by mouth.    . Ascorbic Acid (VITAMIN C PO) Take by mouth daily.    . Biotin w/ Vitamins C & E (HAIR/SKIN/NAILS PO) Take 1 tablet by mouth daily.    . Cholecalciferol (VITAMIN D-3 PO) Take by mouth daily.    . Cyanocobalamin (VITAMIN B 12 PO) Take by mouth daily.    . fluticasone (FLONASE) 50 MCG/ACT nasal spray Place 2 sprays into both nostrils daily.   11  . Hyaluronic Acid 20-60 MG CAPS Take by mouth daily.    . Multiple Vitamins-Minerals (PRESERVISION AREDS 2 PO) Take by mouth daily.    Marland Kitchen olmesartan (BENICAR) 20 MG tablet Take 10 mg by mouth daily.     Marland Kitchen omeprazole (PRILOSEC) 40 MG capsule Take 40 mg by mouth daily.    . polyethylene glycol (MIRALAX / GLYCOLAX) packet Take 17 g by mouth daily.    . Psyllium (METAMUCIL FIBER PO) Take by mouth.    . ferrous sulfate 325 (65 FE) MG tablet Take 325 mg by mouth daily with  breakfast.    . triamcinolone (NASACORT) 55 MCG/ACT AERO nasal inhaler Place 2 sprays into the nose daily. (Patient not taking: Reported on 03/25/2019) 1 Inhaler 12   No facility-administered medications prior to visit.    PAST MEDICAL HISTORY: Past Medical History:  Diagnosis Date  . Allergy   . Anemia   . Blood transfusion without reported diagnosis   . Cataract   . Cervical pain (neck)   . Headache   . HLD (hyperlipidemia)   . Hypertension   . PONV (postoperative nausea and vomiting)   . Sinus disease   . Varicose veins     PAST SURGICAL HISTORY: Past Surgical History:  Procedure Laterality Date  . ABDOMINAL HYSTERECTOMY     partial  . BIOPSY  02/14/2018   Procedure: BIOPSY;  Surgeon: Lemar Lofty., MD;  Location: [facility];  Service: Gastroenterology;;  . BREAST REDUCTION SURGERY  2001  . COLONOSCOPY    . ESOPHAGOGASTRODUODENOSCOPY N/A 02/14/2018   Procedure: ESOPHAGOGASTRODUODENOSCOPY (EGD);  Surgeon: Lemar Lofty., MD;  Location: [facility];  Service: Gastroenterology;  Laterality: N/A;  . REDUCTION MAMMAPLASTY Bilateral 2002  . ROTATOR CUFF REPAIR Right   . UPPER GASTROINTESTINAL ENDOSCOPY      Dana HISTORY: Dana History  Problem Relation Age of Onset  . Cancer Mother   . Diabetes Mother   . Hypertension Mother   . Esophageal cancer Mother   . Cancer Father   . Hyperlipidemia Father   . Hypertension Father   . Varicose Veins Father   . Colon polyps Brother   . Breast cancer Neg Hx   . Colon cancer Neg Hx   . Inflammatory bowel disease Neg Hx   . Liver disease Neg Hx   . Pancreatic cancer Neg Hx   . Rectal cancer Neg Hx   . Stomach cancer Neg Hx     SOCIAL HISTORY: Social History   Socioeconomic History  . Marital status: Married    Spouse name: Not on file  . Number of children: Not on file  . Years of education: Not on file  . Highest education level: Not on file  Occupational History  . Not on file  Tobacco Use    . Smoking status: Never Smoker  . Smokeless tobacco: Never Used  Substance and Sexual Activity  . Alcohol use: Yes    Comment: occasional   . Drug use: Never  . Sexual activity: Not on file  Other Topics Concern  . Not on file  Social History Narrative  . Not on file   Social Determinants of Health   Financial Resource Strain:   . Difficulty of Paying Living Expenses: Not on file  Food Insecurity:   . Worried About Programme researcher, broadcasting/film/video in the Last Year: Not on file  . Ran Out of Food in the Last Year: Not on file  Transportation Needs:   . Lack of Transportation (Medical): Not on file  . Lack of Transportation (Non-Medical): Not on file  Physical Activity:   . Days of Exercise per Week: Not on file  . Minutes of Exercise per Session: Not on file  Stress:   . Feeling of Stress : Not on file  Social Connections:   . Frequency of Communication with Friends and Dana: Not on file  . Frequency of Social Gatherings with Friends and Dana: Not on file  . Attends Religious Services: Not on file  . Active Member of Clubs or Organizations: Not on file  . Attends Banker Meetings: Not on file  . Marital Status: Not on file  Intimate Partner Violence:   . Fear of Current or Ex-Partner: Not on file  . Emotionally Abused: Not on file  . Physically Abused: Not on file  . Sexually Abused: Not on file      PHYSICAL EXAM  Vitals:   03/25/19 1401  BP: 121/73  Pulse: 71  Temp: 97.9 F (36.6 C)  Weight: 161 lb 6.4 oz (73.2 kg)  Height: 5\' 4"  (1.626 m)   Body mass index is 27.7 kg/m.  Generalized: Well developed, in no acute distress  Cardiology: normal rate and rhythm, no murmur noted Respiratory: Clear to auscultation bilaterally Neurological examination  Mentation: Alert oriented to time, place, history taking. Follows all commands speech and language fluent  Cranial nerve II-XII: Pupils were equal round reactive to light. Extraocular movements were full,  visual field were full Motor: The motor testing reveals 5 over 5 strength of all 4 extremities. Good symmetric motor tone is noted throughout.  Gait and station: Gait is normal.  DIAGNOSTIC DATA (LABS, IMAGING, TESTING) - I reviewed patient records, labs, notes, testing and imaging myself where available.  No flowsheet data found.   Lab Results  Component Value Date   WBC 6.9 01/13/2019   HGB 13.5 01/13/2019   HCT 40.4 01/13/2019   MCV 95.7 01/13/2019   PLT 277.0 01/13/2019      Component Value Date/Time   NA 141 02/14/2018 0550   K 4.0 02/14/2018 0550   CL 109 02/14/2018 0550   CO2 24 02/14/2018 0550   GLUCOSE 105 (H) 02/14/2018 0550   BUN 14 02/14/2018 0550   CREATININE 0.63 02/14/2018 0550   CALCIUM 8.2 (L) 02/14/2018 0550   PROT 6.2 (L) 02/13/2018 1306   ALBUMIN 3.8 02/13/2018 1306   AST 25 02/13/2018 1306   ALT 19 02/13/2018 1306   ALKPHOS 56 02/13/2018 1306   BILITOT 0.6 02/13/2018 1306   GFRNONAA >60 02/14/2018 0550   GFRAA >60 02/14/2018 0550   No results found for: CHOL, HDL, LDLCALC, LDLDIRECT, TRIG, CHOLHDL No results found for: [id number] Lab Results  Component Value Date   VITAMINB12 >1500 (H) 04/01/2018   No results found for: TSH     ASSESSMENT AND PLAN 74 y.o. year old female  has a past medical history of Allergy, Anemia, Blood transfusion without reported diagnosis, Cataract, Cervical pain (neck), Headache, HLD (hyperlipidemia), Hypertension, PONV (postoperative nausea and vomiting), Sinus disease, and Varicose veins. here with     ICD-10-CM   1. OSA on CPAP  G47.33    Z99.89     Tanessa continues to feel well.  She is trying to adjust to regular use of CPAP therapy.  She admits that she has not been as compliant as she should be.  Compliance report confirms suboptimal compliance.  She denies any difficulty with her machine or supplies.  She is motivated to use CPAP consistently.  Adverse effects of untreated sleep apnea reviewed in the office  today.  She was encouraged to use CPAP nightly and for greater than 4 hours each night.  She will follow-up with me in 3 months, sooner if needed.  She verbalizes understanding and agreement with this plan.   No orders of the defined types were placed in this encounter.    No orders of the defined types were placed in this encounter.     I spent 15 minutes with the patient. 50% of this time was spent counseling and educating patient on plan of care and medications.    Shawnie Dapper, FNP-C 03/25/2019, 2:38 PM [facility] [address], [address] [place], [place] [address] [phone]

## 2019-06-24 ENCOUNTER — Ambulatory Visit: Admitting: Family Medicine

## 2019-08-18 ENCOUNTER — Other Ambulatory Visit: Payer: Self-pay

## 2019-08-18 ENCOUNTER — Encounter: Payer: Self-pay | Admitting: Family Medicine

## 2019-08-18 ENCOUNTER — Ambulatory Visit (INDEPENDENT_AMBULATORY_CARE_PROVIDER_SITE_OTHER): Payer: Medicare Other | Admitting: Family Medicine

## 2019-08-18 VITALS — BP 131/77 | HR 69 | Ht 64.0 in | Wt 163.0 lb

## 2019-08-18 DIAGNOSIS — G4733 Obstructive sleep apnea (adult) (pediatric): Secondary | ICD-10-CM

## 2019-08-18 DIAGNOSIS — Z9989 Dependence on other enabling machines and devices: Secondary | ICD-10-CM

## 2019-08-18 NOTE — Patient Instructions (Signed)
Please continue using your CPAP regularly. While your insurance requires that you use CPAP at least 4 hours each night on 70% of the nights, I recommend, that you not skip any nights and use it throughout the night if you can. Getting used to CPAP and staying with the treatment long term does take time and patience and discipline. Untreated obstructive sleep apnea when it is moderate to severe can have an adverse impact on cardiovascular health and raise her risk for heart disease, arrhythmias, hypertension, congestive heart failure, stroke and diabetes. Untreated obstructive sleep apnea causes sleep disruption, nonrestorative sleep, and sleep deprivation. This can have an impact on your day to day functioning and cause daytime sleepiness and impairment of cognitive function, memory loss, mood disturbance, and problems focussing. Using CPAP regularly can improve these symptoms.   Follow up in 1 year   Sleep Apnea Sleep apnea affects breathing during sleep. It causes breathing to stop for a short time or to become shallow. It can also increase the risk of:  Heart attack.  Stroke.  Being very overweight (obese).  Diabetes.  Heart failure.  Irregular heartbeat. The goal of treatment is to help you breathe normally again. What are the causes? There are three kinds of sleep apnea:  Obstructive sleep apnea. This is caused by a blocked or collapsed airway.  Central sleep apnea. This happens when the brain does not send the right signals to the muscles that control breathing.  Mixed sleep apnea. This is a combination of obstructive and central sleep apnea. The most common cause of this condition is a collapsed or blocked airway. This can happen if:  Your throat muscles are too relaxed.  Your tongue and tonsils are too large.  You are overweight.  Your airway is too small. What increases the risk?  Being overweight.  Smoking.  Having a small airway.  Being older.  Being  female.  Drinking alcohol.  Taking medicines to calm yourself (sedatives or tranquilizers).  Having family members with the condition. What are the signs or symptoms?  Trouble staying asleep.  Being sleepy or tired during the day.  Getting angry a lot.  Loud snoring.  Headaches in the morning.  Not being able to focus your mind (concentrate).  Forgetting things.  Less interest in sex.  Mood swings.  Personality changes.  Feelings of sadness (depression).  Waking up a lot during the night to pee (urinate).  Dry mouth.  Sore throat. How is this diagnosed?  Your medical history.  A physical exam.  A test that is done when you are sleeping (sleep study). The test is most often done in a sleep lab but may also be done at home. How is this treated?   Sleeping on your side.  Using a medicine to get rid of mucus in your nose (decongestant).  Avoiding the use of alcohol, medicines to help you relax, or certain pain medicines (narcotics).  Losing weight, if needed.  Changing your diet.  Not smoking.  Using a machine to open your airway while you sleep, such as: ? An oral appliance. This is a mouthpiece that shifts your lower jaw forward. ? A CPAP device. This device blows air through a mask when you breathe out (exhale). ? An EPAP device. This has valves that you put in each nostril. ? A BPAP device. This device blows air through a mask when you breathe in (inhale) and breathe out.  Having surgery if other treatments do not work. It is   important to get treatment for sleep apnea. Without treatment, it can lead to:  High blood pressure.  Coronary artery disease.  In men, not being able to have an erection (impotence).  Reduced thinking ability. Follow these instructions at home: Lifestyle  Make changes that your doctor recommends.  Eat a healthy diet.  Lose weight if needed.  Avoid alcohol, medicines to help you relax, and some pain  medicines.  Do not use any products that contain nicotine or tobacco, such as cigarettes, e-cigarettes, and chewing tobacco. If you need help quitting, ask your doctor. General instructions  Take over-the-counter and prescription medicines only as told by your doctor.  If you were given a machine to use while you sleep, use it only as told by your doctor.  If you are having surgery, make sure to tell your doctor you have sleep apnea. You may need to bring your device with you.  Keep all follow-up visits as told by your doctor. This is important. Contact a doctor if:  The machine that you were given to use during sleep bothers you or does not seem to be working.  You do not get better.  You get worse. Get help right away if:  Your chest hurts.  You have trouble breathing in enough air.  You have an uncomfortable feeling in your back, arms, or stomach.  You have trouble talking.  One side of your body feels weak.  A part of your face is hanging down. These symptoms may be an emergency. Do not wait to see if the symptoms will go away. Get medical help right away. Call your local emergency services (911 in the [place] Do not drive yourself to the hospital. Summary  This condition affects breathing during sleep.  The most common cause is a collapsed or blocked airway.  The goal of treatment is to help you breathe normally while you sleep. This information is not intended to replace advice given to you by your health care provider. Make sure you discuss any questions you have with your health care provider. Document Revised: 12/19/2017 Document Reviewed: 10/28/2017 [employer]vier Patient Education  2020 [employer]  

## 2019-08-18 NOTE — Progress Notes (Signed)
PATIENT: Dana Mcgrath DOB: 1945/04/18  REASON FOR VISIT: follow up HISTORY FROM: patient  Chief Complaint  Patient presents with  . Follow-up    Rm 5 here for a cpap f/u. Pt is having no new concerns     HISTORY OF PRESENT ILLNESS: Today 08/18/19 Dana Mcgrath is a 74 y.o. female here today for follow up for OSA on CPAP.  She is doing very well with CPAP at home.  She has adjusted to daily usage.  She does continue to note an air leak but continues to work on that.  Otherwise she is feeling well.  She does note improvement in sleep quality with CPAP therapy.  Compliance report dated 07/18/2019 through 08/16/2019 reveals that she use CPAP every night for compliance of 100%.  She is CPAP greater than 4 hours every night for compliance of 100%.  Average usage was 6 hours and 38 minutes.  Residual AHI was 1.4 on 5 to 12 cm of water and an EPR of 3.  There was a significant leak noted in the 95th percentile of 24.7 L/min.  HISTORY: (copied from my note on 03/25/2019)  Dana Mcgrath is a 74 y.o. female here today for follow up.  She continues CPAP therapy.  She admits over the past 30 days she has not been as consistent with usage.  States that she has not gotten in the habit of using it every night.  She denies difficulty with her machine.  She is uncertain if she feels any better when using it.  Compliance report dated 02/22/2019 through 03/23/2019 reveals that she used CPAP 16 of the last 30 days for compliance of 53%.  13 of the last 30 days she used CPAP greater than 4 hours.  Average usage on days used was 5 hours and 23 minutes.  Residual AHI was 3.8 on 5 to 12 cm of water and an EPR of 3.  There was a leak noted in the 95th percentile of 22.7.  HISTORY: (copied from [facility] note on 11/17/2018)  Update 11/17/2018:Ms. Chapmonis a 74 year old female who is being seen today for initial CPAP compliance visit.CPAPcompliance report from 09/16/2018-10/16/2018 with 30 out of  31 usage days with 26 days greater than 4 hours showing 84% compliance. Average usage 5 hours and 55 minutes with residual AHI 2.4. Leaks in the 95th percentile 20. Pressure in the 95th percentile of 8.6 on min pressure 5 and max pressure 12 with EPR level 3.  She was diagnosed with COVID-19 coronavirus on 09/29/2018 after presenting to urgent care on 09/25/2018 with symptoms and exposure from her husband who was tested positive. She was able to continue use of CPAP for an additional 1-2 weeks but started having difficulty with sinus drainage causing difficulty tolerating CPAP. She did trial use of CPAP intermittently throughout August but unable to tolerate. 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 She had only recently discontinued steroids approximately 1 week ago and feels as though she has slowly returning to her baseline. She has since recently restarted use of CPAP and has since changed supplies. She continues to follow with [facility] for needed supplies. Epworth sleepiness scale 1 which was comparable to prior visit. She does endorse recent weight loss with today's weight 161 pounds (prior 168 pounds). No further concerns at this time.   Interval historyby Dr. Brett Fairy  from prior visits for reference purposes only:  The patient underwent a sleep study on 22 June 2018, she was diagnosed with severe obstructive sleep apnea at an AHI of 33.6 with strong accentuation during REM sleep the REM AHI was 53/h there was no clinically relevant hypoxemia noted and the patient was advised that CPAP would be usually the treatment of choice for this distribution of apneas. Since she has nasal patency problems and chronic sinusitis which has not responded to nasal sprays alone she had also presented to Dr. Christia Reading at [facility]. Her encounter there was on 02 September 2018 just yesterday and he reports that she had nasal discharge that is usually white and thick she has not been able to start treatment for sleep apnea. He lists her current outpatient medication, diagnosed with chronic sphenoid sinusitis, turbinate hypertrophy and obstructive sleep apnea and discussed the previous CT sinus findings. He recommended centennial to me that this may not improve nasal patency at all. For this reason he also recommended a turbinate reduction. He wanted to initiate CPAP therapy prior to surgery. She will get in contact with him once we have started CPAP therapy and I will order auto titration now from my end. Unfortunately I have been very great difficulties connecting with the patient. My nurse had done an intake exam over the phone she endorsed the Epworth Sleepiness Scale at 1 point, fatigue severity score was not requested, I will place an order for auto CPAP based on Dr. Jenne Pane conversation with the patient. Revisit in 2 months with NP    REVIEW OF SYSTEMS: Out of a complete 14 system review of symptoms, the patient complains only of the following symptoms, none and all other reviewed systems are negative.  ESS: 2  ALLERGIES: Allergies  Allergen Reactions  . Aspirin Other (See Comments)    Bleeding stomach ulcers  . Codeine Nausea And Vomiting  . Dexamethasone   . Other     Pholcodine causes nausea and vomiting  . Prednisone Palpitations    HOME MEDICATIONS: Outpatient Medications Prior to Visit  Medication Sig Dispense Refill  . acetaminophen (TYLENOL) 325 MG tablet Take 2 tablets (650 mg total) by mouth every 6 (six) hours as needed for mild pain (or Fever >/= 101). 30 tablet 0  . ALOE VERA CONCENTRATE PO Take by mouth.    . Ascorbic Acid (VITAMIN C PO) Take by mouth daily.    . Biotin w/ Vitamins C & E (HAIR/SKIN/NAILS PO) Take 1 tablet by mouth daily.    . Cholecalciferol (VITAMIN D-3 PO) Take  by mouth daily.    . ferrous sulfate 325 (65 FE) MG tablet Take 325 mg by mouth daily with breakfast.    . fluticasone (FLONASE) 50 MCG/ACT nasal spray Place 2 sprays into both nostrils daily.   11  . Multiple Vitamins-Minerals (PRESERVISION AREDS 2 PO) Take by mouth daily.    Marland Kitchen olmesartan (BENICAR) 20 MG tablet Take 10 mg by mouth daily.     Marland Kitchen omeprazole (PRILOSEC) 40 MG capsule Take 40 mg by mouth daily.    . polyethylene glycol (MIRALAX / GLYCOLAX) packet Take 17 g by mouth daily.    . Psyllium (METAMUCIL FIBER PO) Take by mouth.    . Cyanocobalamin (VITAMIN B 12 PO) Take by mouth daily.    Marland Kitchen Hyaluronic Acid 20-60 MG CAPS Take by mouth daily.    Marland Kitchen triamcinolone (NASACORT) 55 MCG/ACT AERO nasal inhaler Place 2 sprays into the  nose daily. (Patient not taking: Reported on 03/25/2019) 1 Inhaler 12   No facility-administered medications prior to visit.    PAST MEDICAL HISTORY: Past Medical History:  Diagnosis Date  . Allergy   . Anemia   . Blood transfusion without reported diagnosis   . Cataract   . Cervical pain (neck)   . Headache   . HLD (hyperlipidemia)   . Hypertension   . PONV (postoperative nausea and vomiting)   . Sinus disease   . Varicose veins     PAST SURGICAL HISTORY: Past Surgical History:  Procedure Laterality Date  . ABDOMINAL HYSTERECTOMY     partial  . BIOPSY  02/14/2018   Procedure: BIOPSY;  Surgeon: Meridee Score Netty Starring., MD;  Location: [facility];  Service: Gastroenterology;;  . BREAST REDUCTION SURGERY  2001  . COLONOSCOPY    . ESOPHAGOGASTRODUODENOSCOPY N/A 02/14/2018   Procedure: ESOPHAGOGASTRODUODENOSCOPY (EGD);  Surgeon: Lemar Lofty., MD;  Location: [facility];  Service: Gastroenterology;  Laterality: N/A;  . REDUCTION MAMMAPLASTY Bilateral 2002  . ROTATOR CUFF REPAIR Right   . UPPER GASTROINTESTINAL ENDOSCOPY      FAMILY HISTORY: Family History  Problem Relation Age of Onset  . Cancer Mother   . Diabetes Mother   .  Hypertension Mother   . Esophageal cancer Mother   . Cancer Father   . Hyperlipidemia Father   . Hypertension Father   . Varicose Veins Father   . Colon polyps Brother   . Breast cancer Neg Hx   . Colon cancer Neg Hx   . Inflammatory bowel disease Neg Hx   . Liver disease Neg Hx   . Pancreatic cancer Neg Hx   . Rectal cancer Neg Hx   . Stomach cancer Neg Hx     SOCIAL HISTORY: Social History   Socioeconomic History  . Marital status: Married    Spouse name: Not on file  . Number of children: Not on file  . Years of education: Not on file  . Highest education level: Not on file  Occupational History  . Not on file  Tobacco Use  . Smoking status: Never Smoker  . Smokeless tobacco: Never Used  Substance and Sexual Activity  . Alcohol use: Yes    Comment: occasional   . Drug use: Never  . Sexual activity: Not on file  Other Topics Concern  . Not on file  Social History Narrative  . Not on file   Social Determinants of Health   Financial Resource Strain:   . Difficulty of Paying Living Expenses:   Food Insecurity:   . Worried About Programme researcher, broadcasting/film/video in the Last Year:   . Barista in the Last Year:   Transportation Needs:   . Freight forwarder (Medical):   Marland Kitchen Lack of Transportation (Non-Medical):   Physical Activity:   . Days of Exercise per Week:   . Minutes of Exercise per Session:   Stress:   . Feeling of Stress :   Social Connections:   . Frequency of Communication with Friends and Family:   . Frequency of Social Gatherings with Friends and Family:   . Attends Religious Services:   . Active Member of Clubs or Organizations:   . Attends Banker Meetings:   Marland Kitchen Marital Status:   Intimate Partner Violence:   . Fear of Current or Ex-Partner:   . Emotionally Abused:   Marland Kitchen Physically Abused:   . Sexually Abused:  PHYSICAL EXAM  Vitals:   08/18/19 1458  BP: 131/77  Pulse: 69  Weight: 163 lb (73.9 kg)  Height: 5\' 4"  (1.626  m)   Body mass index is 27.98 kg/m.  Generalized: Well developed, in no acute distress  Cardiology: normal rate and rhythm, no murmur noted Respiratory: clear to auscultation bilaterally  Neurological examination  Mentation: Alert oriented to time, place, history taking. Follows all commands speech and language fluent Cranial nerve II-XII: Pupils were equal round reactive to light. Extraocular movements were full, visual field were full  Motor: The motor testing reveals 5 over 5 strength of all 4 extremities. Good symmetric motor tone is noted throughout.  Gait and station: Gait is normal.   DIAGNOSTIC DATA (LABS, IMAGING, TESTING) - I reviewed patient records, labs, notes, testing and imaging myself where available.  No flowsheet data found.   Lab Results  Component Value Date   WBC 6.9 01/13/2019   HGB 13.5 01/13/2019   HCT 40.4 01/13/2019   MCV 95.7 01/13/2019   PLT 277.0 01/13/2019      Component Value Date/Time   NA 141 02/14/2018 0550   K 4.0 02/14/2018 0550   CL 109 02/14/2018 0550   CO2 24 02/14/2018 0550   GLUCOSE 105 (H) 02/14/2018 0550   BUN 14 02/14/2018 0550   CREATININE 0.63 02/14/2018 0550   CALCIUM 8.2 (L) 02/14/2018 0550   PROT 6.2 (L) 02/13/2018 1306   ALBUMIN 3.8 02/13/2018 1306   AST 25 02/13/2018 1306   ALT 19 02/13/2018 1306   ALKPHOS 56 02/13/2018 1306   BILITOT 0.6 02/13/2018 1306   GFRNONAA >60 02/14/2018 0550   GFRAA >60 02/14/2018 0550   No results found for: CHOL, HDL, LDLCALC, LDLDIRECT, TRIG, CHOLHDL No results found for: 02/16/2018 Lab Results  Component Value Date   VITAMINB12 >1500 (H) 04/01/2018   No results found for: TSH     ASSESSMENT AND PLAN 74 y.o. year old female  has a past medical history of Allergy, Anemia, Blood transfusion without reported diagnosis, Cataract, Cervical pain (neck), Headache, HLD (hyperlipidemia), Hypertension, PONV (postoperative nausea and vomiting), Sinus disease, and Varicose veins. here with      ICD-10-CM   1. OSA on CPAP  G47.33    Z99.89     Ms. 66 is doing very well on CPAP therapy.  Compliance report reveals excellent compliance.  We have discussed concerns of elevated leak.  She will continue to work with DME company for correct mask and headgear fit.  Apnea is well managed.  She was encouraged to continue using CPAP nightly and for greater than 4 hours each night.  Healthy lifestyle habits encouraged.  She will follow-up with me in 1 year, sooner if needed.  She verbalizes understanding and agreement with this plan.   No orders of the defined types were placed in this encounter.    No orders of the defined types were placed in this encounter.     I spent 15 minutes with the patient. 50% of this time was spent counseling and educating patient on plan of care and medications.    Sibyl Parr, FNP-C 08/18/2019, 4:14 PM [facility] [address] [place], [place] [phone]

## 2020-04-20 ENCOUNTER — Ambulatory Visit (INDEPENDENT_AMBULATORY_CARE_PROVIDER_SITE_OTHER): Payer: Medicare Other | Admitting: Podiatry

## 2020-04-20 ENCOUNTER — Ambulatory Visit (INDEPENDENT_AMBULATORY_CARE_PROVIDER_SITE_OTHER): Payer: Medicare Other

## 2020-04-20 ENCOUNTER — Other Ambulatory Visit: Payer: Self-pay

## 2020-04-20 DIAGNOSIS — S92501A Displaced unspecified fracture of right lesser toe(s), initial encounter for closed fracture: Secondary | ICD-10-CM | POA: Diagnosis not present

## 2020-04-20 DIAGNOSIS — M21611 Bunion of right foot: Secondary | ICD-10-CM

## 2020-04-20 DIAGNOSIS — M2042 Other hammer toe(s) (acquired), left foot: Secondary | ICD-10-CM | POA: Diagnosis not present

## 2020-04-20 DIAGNOSIS — M7741 Metatarsalgia, right foot: Secondary | ICD-10-CM

## 2020-04-20 DIAGNOSIS — M2041 Other hammer toe(s) (acquired), right foot: Secondary | ICD-10-CM

## 2020-04-20 DIAGNOSIS — M7742 Metatarsalgia, left foot: Secondary | ICD-10-CM

## 2020-04-20 DIAGNOSIS — M2011 Hallux valgus (acquired), right foot: Secondary | ICD-10-CM

## 2020-04-20 DIAGNOSIS — M2012 Hallux valgus (acquired), left foot: Secondary | ICD-10-CM

## 2020-04-20 DIAGNOSIS — M21612 Bunion of left foot: Secondary | ICD-10-CM

## 2020-04-20 DIAGNOSIS — M79671 Pain in right foot: Secondary | ICD-10-CM

## 2020-04-21 ENCOUNTER — Encounter: Payer: Self-pay | Admitting: Podiatry

## 2020-04-21 NOTE — Progress Notes (Signed)
  Subjective:  Patient ID: Dana Mcgrath, female    DOB: November 15, 1945,  MRN: [id number]  Chief Complaint  Patient presents with  . Foot Pain    Bilateral foot pain PT stated that she stubbed her foot against the foot of her bed a few days and has been having pain with her right foot since then. She stated that both feet have a burning sensation and pain in the arches.     75 y.o. female presents with the above complaint. History confirmed with patient.   Objective:  Physical Exam: warm, good capillary refill, no trophic changes or ulcerative lesions, normal DP and PT pulses and normal sensory exam. Left Foot: diffuse plantar metatarsal pain across the MTPJs  Right Foot: pain w/ ROM of 4th toe, palpation proximal phalanx   No images are attached to the encounter.  Radiographs: X-ray of both feet: intraarticular minimally displaced fracture of the 4th proximal phalanx Assessment:   1. Fracture of fourth toe, right, closed, initial encounter   2. Metatarsalgia of both feet   3. Hammertoe of right foot   4. Hammertoe of left foot   5. Hallux valgus with bunions, left   6. Hallux valgus with bunions, right      Plan:  Patient was evaluated and treated and all questions answered.  Discussed treatment of digital fractures. Recommend no operative treatment. Buddy splinting demonstrated  Discussed etiology of pain in metarsalgia, how this relates to shoe gear, foot type, and plantar fat pad atrophy. Recommend offloading with a metatarsal pad today which I put in her shoes. If this is helpful we will consider CMOs with integrated metatarsal offloading. Discussed appropriate shoe gear. Contributing factors include moderate hallux valgus and extensor substitution digital contractures  No follow-ups on file.

## 2020-05-05 ENCOUNTER — Other Ambulatory Visit: Payer: Self-pay | Admitting: Internal Medicine

## 2020-05-05 DIAGNOSIS — Z1231 Encounter for screening mammogram for malignant neoplasm of breast: Secondary | ICD-10-CM

## 2020-06-23 ENCOUNTER — Inpatient Hospital Stay: Admission: RE | Admit: 2020-06-23 | Payer: Medicare HMO | Source: Ambulatory Visit

## 2020-06-26 ENCOUNTER — Ambulatory Visit: Payer: Medicare Other | Admitting: Podiatry

## 2020-07-26 ENCOUNTER — Other Ambulatory Visit (HOSPITAL_COMMUNITY): Payer: Self-pay

## 2020-07-27 ENCOUNTER — Ambulatory Visit (HOSPITAL_COMMUNITY)

## 2020-08-11 ENCOUNTER — Ambulatory Visit
Admission: RE | Admit: 2020-08-11 | Discharge: 2020-08-11 | Disposition: A | Discharge status: Home / Self Care | Payer: Medicare Other | Source: Ambulatory Visit | Attending: Internal Medicine | Admitting: Internal Medicine

## 2020-08-11 ENCOUNTER — Other Ambulatory Visit: Payer: Self-pay

## 2020-08-11 ENCOUNTER — Ambulatory Visit

## 2020-08-11 DIAGNOSIS — Z1231 Encounter for screening mammogram for malignant neoplasm of breast: Secondary | ICD-10-CM

## 2020-08-16 NOTE — Discharge Instructions (Signed)
Zoledronic Acid Injection (Paget's Disease, Osteoporosis) What is this medicine? ZOLEDRONIC ACID (ZOE le dron ik AS id) slows calcium loss from bones. It treats Paget's disease and osteoporosis. It may be used in other people at risk for bone loss. This medicine may be used for other purposes; ask your health care provider or pharmacist if you have questions. COMMON BRAND NAME(S): Reclast, Zometa What should I tell my health care provider before I take this medicine? They need to know if you have any of these conditions:  bleeding disorder  cancer  dental disease  kidney disease  low levels of calcium in the blood  low red blood cell counts  lung or breathing disease (asthma)  receiving steroids like dexamethasone or prednisone  an unusual or allergic reaction to zoledronic acid, other medicines, foods, dyes, or preservatives  pregnant or trying to get pregnant  breast-feeding How should I use this medicine? This drug is injected into a vein. It is given by a health care provider in a hospital or clinic setting. A special MedGuide will be given to you before each treatment. Be sure to read this information carefully each time. Talk to your health care provider about the use of this drug in children. Special care may be needed. Overdosage: If you think you have taken too much of this medicine contact a poison control center or emergency room at once. NOTE: This medicine is only for you. Do not share this medicine with others. What if I miss a dose? Keep appointments for follow-up doses. It is important not to miss your dose. Call your health care provider if you are unable to keep an appointment. What may interact with this medicine?  certain antibiotics given by injection  NSAIDs, medicines for pain and inflammation, like ibuprofen or naproxen  some diuretics like bumetanide, furosemide  teriparatide This list may not describe all possible interactions. Give your health  care provider a list of all the medicines, herbs, non-prescription drugs, or dietary supplements you use. Also tell them if you smoke, drink alcohol, or use illegal drugs. Some items may interact with your medicine. What should I watch for while using this medicine? Visit your health care provider for regular checks on your progress. It may be some time before you see the benefit from this drug. Some people who take this drug have severe bone, joint, or muscle pain. This drug may also increase your risk for jaw problems or a broken thigh bone. Tell your health care provider right away if you have severe pain in your jaw, bones, joints, or muscles. Tell you health care provider if you have any pain that does not go away or that gets worse. You should make sure you get enough calcium and vitamin D while you are taking this drug. Discuss the foods you eat and the vitamins you take with your health care provider. You may need blood work done while you are taking this drug. Tell your dentist and dental surgeon that you are taking this drug. You should not have major dental surgery while on this drug. See your dentist to have a dental exam and fix any dental problems before starting this drug. Take good care of your teeth while on this drug. Make sure you see your dentist for regular follow-up appointments. What side effects may I notice from receiving this medicine? Side effects that you should report to your doctor or health care provider as soon as possible:  allergic reactions (skin rash, itching or   hives; swelling of the face, lips, or tongue)  bone pain  infection (fever, chills, cough, sore throat, pain or trouble passing urine)  jaw pain, especially after dental work  joint pain  kidney injury (trouble passing urine or change in the amount of urine)  low calcium levels (fast heartbeat; muscle cramps or pain; pain, tingling, or numbness in the hands or feet; seizures)  low red blood cell  counts (trouble breathing; feeling faint; lightheaded, falls; unusually weak or tired)  muscle pain  palpitations  redness, blistering, peeling, or loosening of the skin, including inside the mouth Side effects that usually do not require medical attention (report to your doctor or health care provider if they continue or are bothersome):  diarrhea  eye irritation, itching, or pain  fever  general ill feeling or flu-like symptoms  headache  increase in blood pressure  nausea  pain, redness, or irritation at site where injected  stomach pain  upset stomach This list may not describe all possible side effects. Call your doctor for medical advice about side effects. You may report side effects to FDA at 1-800-FDA-1088. Where should I keep my medicine? This drug is given in a hospital or clinic. It will not be stored at home. NOTE: This sheet is a summary. It may not cover all possible information. If you have questions about this medicine, talk to your doctor, pharmacist, or health care provider.  2021 Elsevier/Gold Standard (2018-12-21 11:46:18)   

## 2020-08-17 ENCOUNTER — Other Ambulatory Visit: Payer: Self-pay

## 2020-08-17 ENCOUNTER — Ambulatory Visit (HOSPITAL_COMMUNITY)
Admission: RE | Admit: 2020-08-17 | Discharge: 2020-08-17 | Disposition: A | Discharge status: Home / Self Care | Payer: Medicare Other | Source: Ambulatory Visit | Attending: Internal Medicine | Admitting: Internal Medicine

## 2020-08-17 DIAGNOSIS — M810 Age-related osteoporosis without current pathological fracture: Secondary | ICD-10-CM | POA: Insufficient documentation

## 2020-08-17 MED ORDER — ZOLEDRONIC ACID 5 MG/100ML IV SOLN: 5.0000 mg | Freq: Once | INTRAVENOUS | Status: AC

## 2020-08-17 MED ORDER — ZOLEDRONIC ACID 5 MG/100ML IV SOLN
INTRAVENOUS | Status: AC
  Administered 2020-08-17: 5 mg via INTRAVENOUS
  Filled 2020-08-17: qty 100

## 2020-08-22 ENCOUNTER — Ambulatory Visit: Admitting: Family Medicine

## 2020-08-22 ENCOUNTER — Encounter: Payer: Self-pay | Admitting: Family Medicine

## 2020-08-22 NOTE — Progress Notes (Deleted)
PATIENT: Dana Mcgrath DOB: 02/01/1946  REASON FOR VISIT: follow up HISTORY FROM: patient  No chief complaint on file.    HISTORY OF PRESENT ILLNESS: 08/22/2020 ALL:  She returns for follow up for OSA on CPAP.     08/18/2019 ALL:  Dana Mcgrath is a 75 y.o. female here today for follow up for OSA on CPAP.  She is doing very well with CPAP at home.  She has adjusted to daily usage.  She does continue to note an air leak but continues to work on that.  Otherwise she is feeling well.  She does note improvement in sleep quality with CPAP therapy.  Compliance report dated 07/18/2019 through 08/16/2019 reveals that she use CPAP every night for compliance of 100%.  She is CPAP greater than 4 hours every night for compliance of 100%.  Average usage was 6 hours and 38 minutes.  Residual AHI was 1.4 on 5 to 12 cm of water and an EPR of 3.  There was a significant leak noted in the 95th percentile of 24.7 L/min.  HISTORY: (copied from my note on 03/25/2019)  Dana Mcgrath is a 75 y.o. female here today for follow up.  She continues CPAP therapy.  She admits over the past 30 days she has not been as consistent with usage.  States that she has not gotten in the habit of using it every night.  She denies difficulty with her machine.  She is uncertain if she feels any better when using it.  Compliance report dated 02/22/2019 through 03/23/2019 reveals that she used CPAP 16 of the last 30 days for compliance of 53%.  13 of the last 30 days she used CPAP greater than 4 hours.  Average usage on days used was 5 hours and 23 minutes.  Residual AHI was 3.8 on 5 to 12 cm of water and an EPR of 3.  There was a leak noted in the 95th percentile of 22.7.  HISTORY: (copied from [place] Mccue's note on 11/17/2018)  Update 11/17/2018:Dana Mcgrath a 75 year old female who is being seen today for initial CPAP compliance visit.CPAPcompliance report from 09/16/2018-10/16/2018 with 30 out of 31 usage days with  26 days greater than 4 hours showing 84% compliance. Average usage 5 hours and 55 minutes with residual AHI 2.4. Leaks in the 95th percentile 20. Pressure in the 95th percentile of 8.6 on min pressure 5 and max pressure 12 with EPR level 3.  She was diagnosed with COVID-19 coronavirus on 09/29/2018 after presenting to urgent care on 09/25/2018 with symptoms and exposure from her husband who was tested positive. She was able to continue use of CPAP for an additional 1-2 weeks but started having difficulty with sinus drainage causing difficulty tolerating CPAP. She did trial use of CPAP intermittently throughout August but unable to tolerate. 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 She had only recently discontinued steroids approximately 1 week ago and feels as though she has slowly returning to her baseline. She has since recently restarted use of CPAP and has since changed supplies. She continues to follow with [facility] for needed supplies. Epworth sleepiness scale 1 which was comparable to prior visit. She does endorse recent weight loss with today's weight 161 pounds (prior 168 pounds). No further concerns at this time.   Interval historyby Dr. Vickey Huger from prior visits for  reference purposes only:  The patient underwent a sleep study on 22 June 2018, she was diagnosed with severe obstructive sleep apnea at an AHI of 33.6 with strong accentuation during REM sleep the REM AHI was 53/h there was no clinically relevant hypoxemia noted and the patient was advised that CPAP would be usually the treatment of choice for this distribution of apneas. Since she has nasal patency problems and chronic sinusitis which has not responded to nasal sprays alone she had also presented to Dr. Christia Readingwight [facility]. Her  encounter there was on 02 September 2018 just yesterday and he reports that she had nasal discharge that is usually white and thick she has not been able to start treatment for sleep apnea. He lists her current outpatient medication, diagnosed with chronic sphenoid sinusitis, turbinate hypertrophy and obstructive sleep apnea and discussed the previous CT sinus findings. He recommended centennial to me that this may not improve nasal patency at all. For this reason he also recommended a turbinate reduction. He wanted to initiate CPAP therapy prior to surgery. She will get in contact with him once we have started CPAP therapy and I will order auto titration now from my end. Unfortunately I have been very great difficulties connecting with the patient. My nurse had done an intake exam over the phone she endorsed the Epworth Sleepiness Scale at 1 point, fatigue severity score was not requested, I will place an order for auto CPAP based on Dr. Jenne PaneBates conversation with the patient. Revisit in 2 months with NP    REVIEW OF SYSTEMS: Out of a complete 14 system review of symptoms, the patient complains only of the following symptoms, none and all other reviewed systems are negative.  ESS: 2  ALLERGIES: Allergies  Allergen Reactions  . Aspirin Other (See Comments)    Bleeding stomach ulcers  . Codeine Nausea And Vomiting  . Dexamethasone   . Other     Pholcodine causes nausea and vomiting  . Prednisone Palpitations    HOME MEDICATIONS: Outpatient Medications Prior to Visit  Medication Sig Dispense Refill  . acetaminophen (TYLENOL) 325 MG tablet Take 2 tablets (650 mg total) by mouth every 6 (six) hours as needed for mild pain (or Fever >/= 101). 30 tablet 0  . ALOE VERA CONCENTRATE PO Take by mouth.    . Ascorbic Acid (VITAMIN C PO) Take by mouth daily.    . Biotin w/ Vitamins C & E (HAIR/SKIN/NAILS PO) Take 1 tablet by mouth daily.    . Cholecalciferol (VITAMIN D-3 PO) Take by mouth daily.    .  ferrous sulfate 325 (65 FE) MG tablet Take 325 mg by mouth daily with breakfast.    . fluticasone (FLONASE) 50 MCG/ACT nasal spray Place 2 sprays into both nostrils daily.   11  . Multiple Vitamins-Minerals (PRESERVISION AREDS 2 PO) Take by mouth daily.    Marland Kitchen. olmesartan (BENICAR) 20 MG tablet Take 10 mg by mouth daily.     Marland Kitchen. omeprazole (PRILOSEC) 40 MG capsule Take 40 mg by mouth daily.    . polyethylene glycol (MIRALAX / GLYCOLAX) packet Take 17 g by mouth daily.    . Psyllium (METAMUCIL FIBER PO) Take by mouth.     No facility-administered medications prior to visit.    PAST MEDICAL HISTORY: Past Medical History:  Diagnosis Date  . Allergy   . Anemia   . Blood transfusion without reported diagnosis   . Cataract   . Cervical pain (  neck)   . Headache   . HLD (hyperlipidemia)   . Hypertension   . PONV (postoperative nausea and vomiting)   . Sinus disease   . Varicose veins     PAST SURGICAL HISTORY: Past Surgical History:  Procedure Laterality Date  . ABDOMINAL HYSTERECTOMY     partial  . BIOPSY  02/14/2018   Procedure: BIOPSY;  Surgeon: Meridee Score Netty Starring., MD;  Location: [facility];  Service: Gastroenterology;;  . BREAST REDUCTION SURGERY  2001  . COLONOSCOPY    . ESOPHAGOGASTRODUODENOSCOPY N/A 02/14/2018   Procedure: ESOPHAGOGASTRODUODENOSCOPY (EGD);  Surgeon: Lemar Lofty., MD;  Location: [facility];  Service: Gastroenterology;  Laterality: N/A;  . REDUCTION MAMMAPLASTY Bilateral 2002  . ROTATOR CUFF REPAIR Right   . UPPER GASTROINTESTINAL ENDOSCOPY      FAMILY HISTORY: Family History  Problem Relation Age of Onset  . Cancer Mother   . Diabetes Mother   . Hypertension Mother   . Esophageal cancer Mother   . Cancer Father   . Hyperlipidemia Father   . Hypertension Father   . Varicose Veins Father   . Colon polyps Brother   . Breast cancer Neg Hx   . Colon cancer Neg Hx   . Inflammatory bowel disease Neg Hx   . Liver disease Neg Hx   .  Pancreatic cancer Neg Hx   . Rectal cancer Neg Hx   . Stomach cancer Neg Hx     SOCIAL HISTORY: Social History   Socioeconomic History  . Marital status: Married    Spouse name: Not on file  . Number of children: Not on file  . Years of education: Not on file  . Highest education level: Not on file  Occupational History  . Not on file  Tobacco Use  . Smoking status: Never Smoker  . Smokeless tobacco: Never Used  Vaping Use  . Vaping Use: Never used  Substance and Sexual Activity  . Alcohol use: Yes    Comment: occasional   . Drug use: Never  . Sexual activity: Not on file  Other Topics Concern  . Not on file  Social History Narrative  . Not on file   Social Determinants of Health   Financial Resource Strain: Not on file  Food Insecurity: Not on file  Transportation Needs: Not on file  Physical Activity: Not on file  Stress: Not on file  Social Connections: Not on file  Intimate Partner Violence: Not on file      PHYSICAL EXAM  There were no vitals filed for this visit. There is no height or weight on file to calculate BMI.  Generalized: Well developed, in no acute distress  Cardiology: normal rate and rhythm, no murmur noted Respiratory: clear to auscultation bilaterally  Neurological examination  Mentation: Alert oriented to time, place, history taking. Follows all commands speech and language fluent Cranial nerve II-XII: Pupils were equal round reactive to light. Extraocular movements were full, visual field were full  Motor: The motor testing reveals 5 over 5 strength of all 4 extremities. Good symmetric motor tone is noted throughout.  Gait and station: Gait is normal.   DIAGNOSTIC DATA (LABS, IMAGING, TESTING) - I reviewed patient records, labs, notes, testing and imaging myself where available.  No flowsheet data found.   Lab Results  Component Value Date   WBC 6.9 01/13/2019   HGB 13.5 01/13/2019   HCT 40.4 01/13/2019   MCV 95.7 01/13/2019    PLT 277.0 01/13/2019  Component Value Date/Time   NA 141 02/14/2018 0550   K 4.0 02/14/2018 0550   CL 109 02/14/2018 0550   CO2 24 02/14/2018 0550   GLUCOSE 105 (H) 02/14/2018 0550   BUN 14 02/14/2018 0550   CREATININE 0.63 02/14/2018 0550   CALCIUM 8.2 (L) 02/14/2018 0550   PROT 6.2 (L) 02/13/2018 1306   ALBUMIN 3.8 02/13/2018 1306   AST 25 02/13/2018 1306   ALT 19 02/13/2018 1306   ALKPHOS 56 02/13/2018 1306   BILITOT 0.6 02/13/2018 1306   GFRNONAA >60 02/14/2018 0550   GFRAA >60 02/14/2018 0550   No results found for: CHOL, HDL, LDLCALC, LDLDIRECT, TRIG, CHOLHDL No results found for: [id number] Lab Results  Component Value Date   VITAMINB12 >1500 (H) 04/01/2018   No results found for: TSH     ASSESSMENT AND PLAN 75 y.o. year old female  has a past medical history of Allergy, Anemia, Blood transfusion without reported diagnosis, Cataract, Cervical pain (neck), Headache, HLD (hyperlipidemia), Hypertension, PONV (postoperative nausea and vomiting), Sinus disease, and Varicose veins. here with   No diagnosis found.   Dana Mcgrath is doing very well on CPAP therapy.  Compliance report reveals excellent compliance.  We have discussed concerns of elevated leak.  She will continue to work with DME company for correct mask and headgear fit.  Apnea is well managed.  She was encouraged to continue using CPAP nightly and for greater than 4 hours each night.  Healthy lifestyle habits encouraged.  She will follow-up with me in 1 year, sooner if needed.  She verbalizes understanding and agreement with this plan.   No orders of the defined types were placed in this encounter.    No orders of the defined types were placed in this encounter.     Shawnie Dapper, FNP-C 08/22/2020, 7:31 AM [facility] [address] [place], [place] [address] [phone]

## 2020-08-24 ENCOUNTER — Ambulatory Visit: Payer: Medicare Other | Admitting: Podiatry

## 2020-08-29 ENCOUNTER — Other Ambulatory Visit: Payer: Self-pay

## 2020-08-29 ENCOUNTER — Encounter: Payer: Self-pay | Admitting: Podiatry

## 2020-08-29 ENCOUNTER — Ambulatory Visit (INDEPENDENT_AMBULATORY_CARE_PROVIDER_SITE_OTHER): Payer: Medicare Other | Admitting: Podiatry

## 2020-08-29 DIAGNOSIS — S92501D Displaced unspecified fracture of right lesser toe(s), subsequent encounter for fracture with routine healing: Secondary | ICD-10-CM | POA: Diagnosis not present

## 2020-08-29 DIAGNOSIS — L84 Corns and callosities: Secondary | ICD-10-CM

## 2020-08-29 DIAGNOSIS — M7741 Metatarsalgia, right foot: Secondary | ICD-10-CM | POA: Diagnosis not present

## 2020-08-29 DIAGNOSIS — M7742 Metatarsalgia, left foot: Secondary | ICD-10-CM

## 2020-08-29 NOTE — Patient Instructions (Signed)
Look for urea 40% cream or ointment and apply to the thickened dry skin / calluses. This can be bought over the counter, at a pharmacy or online such as [employer]. Use it 1-2 daily with a pumice stone to shave off the dead skin   Have your orthotics adjusted with a 5th metatarsal base offload   Shoe brands we talked about: [name] comfort, Hoka slides (sandals), Oofos (slides/sandals)

## 2020-08-29 NOTE — Progress Notes (Signed)
  Subjective:  Patient ID: Dana Mcgrath, female    DOB: [birth year],  MRN: [id number]  Chief Complaint  Patient presents with   Foot Pain    4 month follow up, bilateral foot pain -left 4th toe fracture    75 y.o. female returns with the above complaint. History confirmed with patient.  Fracture toe is feeling better.  She has painful calluses now.  Objective:  Physical Exam: warm, good capillary refill, no trophic changes or ulcerative lesions, normal DP and PT pulses and normal sensory exam. Left Foot: diffuse plantar metatarsal pain across the MTPJs plantar fifth and submetatarsal 1 medial pinch hallux callus Right Foot: pain w/ ROM of 4th toe, palpation proximal phalanx submetatarsal 1 medial pinch hallux callus   Radiographs: X-ray of both feet: intraarticular minimally displaced fracture of the 4th proximal phalanx Assessment:   1. Fracture of fourth toe, right, closed, with routine healing, subsequent encounter   2. Metatarsalgia of both feet   3. Callus of foot      Plan:  Patient was evaluated and treated and all questions answered.  Fracture is healed at this point  All symptomatic hyperkeratoses were safely debrided with a sterile #15 blade to patient's level of comfort without incident. We discussed preventative and palliative care of these lesions including supportive and accommodative shoegear, padding, prefabricated and custom molded accommodative orthoses, use of a pumice stone and lotions/creams daily.  Discussed use of urea cream with her.  Discussed different types of shoe gear.  She had orthotics made at [facility] and I think he is fit her well and are offloading most of the lesions with exception of the plantar fifth metatarsal base which is painful for her.  Think she should have this adjusted to offload even further.  If that becomes more painful she may have some bursitis there that may benefit from cortisone injection but not currently severe  A total of  35 minutes was spent today on the review of the patients medical record including taking of the history, examining and treating the patient and documentation in the chart.   Return if symptoms worsen or fail to improve.

## 2020-12-14 NOTE — Progress Notes (Signed)
PATIENT: Dana Mcgrath DOB: 08/06/1945  REASON FOR VISIT: follow up HISTORY FROM: patient  Chief Complaint  Patient presents with   Obstructive Sleep Apnea    New rm, husband. Here for CPAP f/u. Pt reports doing well. Pt occassionally wakes up w a dry mouth.     HISTORY OF PRESENT ILLNESS: 12/18/20 ALL: Dana Mcgrath returns for follow up for OSA on CPAP. She reports that she is doing well. She is using CPAP most every night. She admits that she will skip a night from time to time. She may not use for the full 4 hours at times due to back pain with lying in bed. She has to get up and move around and does not always restart CPAP. She feels that she is sleeping well. No concerns with machine or supplies.     08/18/2019 ALL:  Dana Mcgrath is a 75 y.o. female here today for follow up for OSA on CPAP.  She is doing very well with CPAP at home.  She has adjusted to daily usage.  She does continue to note an air leak but continues to work on that.  Otherwise she is feeling well.  She does note improvement in sleep quality with CPAP therapy.  Compliance report dated 07/18/2019 through 08/16/2019 reveals that she use CPAP every night for compliance of 100%.  She is CPAP greater than 4 hours every night for compliance of 100%.  Average usage was 6 hours and 38 minutes.  Residual AHI was 1.4 on 5 to 12 cm of water and an EPR of 3.  There was a significant leak noted in the 95th percentile of 24.7 L/min.  HISTORY: (copied from my note on 03/25/2019)  Dana Mcgrath is a 75 y.o. female here today for follow up.  She continues CPAP therapy.  She admits over the past 30 days she has not been as consistent with usage.  States that she has not gotten in the habit of using it every night.  She denies difficulty with her machine.  She is uncertain if she feels any better when using it.   Compliance report dated 02/22/2019 through 03/23/2019 reveals that she used CPAP 16 of the last 30 days for  compliance of 53%.  13 of the last 30 days she used CPAP greater than 4 hours.  Average usage on days used was 5 hours and 23 minutes.  Residual AHI was 3.8 on 5 to 12 cm of water and an EPR of 3.  There was a leak noted in the 95th percentile of 22.7.   HISTORY: (copied from [name]'s note on 11/17/2018)   Update 11/17/2018: Ms. Currington is a 75 year old female who is being seen today for initial CPAP compliance visit. CPAP compliance report from 09/16/2018-10/16/2018 with 30 out of 31 usage days with 26 days greater than 4 hours showing 84% compliance.  Average usage 5 hours and 55 minutes with residual AHI 2.4.  Leaks in the 95th percentile 20.  Pressure in the 95th percentile of 8.6 on min pressure 5 and max pressure 12 with EPR level 3.  She was diagnosed with COVID-19 coronavirus on 09/29/2018 after presenting to urgent care on 09/25/2018 with symptoms and exposure from her husband who was tested positive.  She was able to continue use of CPAP for an additional 1-2 weeks but started having difficulty with sinus drainage causing difficulty tolerating CPAP.  She did trial use of CPAP intermittently throughout August but unable to tolerate.  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  She had only recently discontinued steroids approximately 1 week ago and feels as though she has slowly returning to her baseline.  She has since recently restarted use of CPAP and has since changed supplies.  She continues to follow with [facility] for needed supplies.  Epworth sleepiness scale 1 which was comparable to prior visit.  She does endorse recent weight loss with today's weight 161 pounds (prior 168 pounds).  No further concerns at this time.     Interval history by Dr. Vickey Huger from prior visits for reference purposes only:  The patient underwent a sleep study on  22 June 2018, she was diagnosed with severe obstructive sleep apnea at an AHI of 33.6 with strong accentuation during REM sleep the REM AHI was 53/h there was no clinically relevant hypoxemia noted and the patient was advised that CPAP would be usually the treatment of choice for this distribution of apneas.  Since she has nasal patency problems and chronic sinusitis which has not responded to nasal sprays alone she had also presented to Dr. Christia Reading at [facility].  Her encounter there was on 02 September 2018 just yesterday and he reports that she had nasal discharge that is usually white and thick she has not been able to start treatment for sleep apnea.  He lists her current outpatient medication, diagnosed with chronic sphenoid sinusitis, turbinate hypertrophy and obstructive sleep apnea and discussed the previous CT sinus findings.  He recommended [facility] to me that this may not improve nasal patency at all.  For this reason he also recommended a turbinate reduction.  He wanted to initiate CPAP therapy prior to surgery.  She will get in contact with him once we have started CPAP therapy and I will order auto titration now from my end.  Unfortunately I have been very great difficulties connecting with the patient.  My nurse had done an intake exam over the phone she endorsed the Epworth Sleepiness Scale at 1 point, fatigue severity score was not requested, I will place an order for auto CPAP based on Dr. Jenne Pane conversation with the patient.  Revisit in 2 months with NP     REVIEW OF SYSTEMS: Out of a complete 14 system review of symptoms, the patient complains only of the following symptoms, back pain and all other reviewed systems are negative.  ESS: 2  ALLERGIES: Allergies  Allergen Reactions   Aspirin Other (See Comments)    Bleeding stomach ulcers   Codeine Nausea And Vomiting   Dexamethasone    Other     Pholcodine causes nausea and vomiting   Prednisone  Palpitations    HOME MEDICATIONS: Outpatient Medications Prior to Visit  Medication Sig Dispense Refill   acetaminophen (TYLENOL) 325 MG tablet Take 2 tablets (650 mg total) by mouth every 6 (six) hours as needed for mild pain (or Fever >/= 101). 30 tablet 0   ALOE VERA CONCENTRATE PO Take by mouth.     Ascorbic Acid (VITAMIN C PO) Take by mouth daily.     Biotin w/ Vitamins C & E (HAIR/SKIN/NAILS PO) Take 1 tablet by mouth daily.     Cholecalciferol (VITAMIN D-3 PO) Take by mouth daily.     fluticasone (FLONASE) 50 MCG/ACT nasal spray Place 2 sprays into both nostrils daily.   11  Multiple Vitamins-Minerals (PRESERVISION AREDS 2 PO) Take by mouth daily.     olmesartan (BENICAR) 20 MG tablet Take 10 mg by mouth daily.      omeprazole (PRILOSEC) 40 MG capsule Take 40 mg by mouth daily.     polyethylene glycol (MIRALAX / GLYCOLAX) packet Take 17 g by mouth daily.     Psyllium (METAMUCIL FIBER PO) Take by mouth.     ferrous sulfate 325 (65 FE) MG tablet Take 325 mg by mouth daily with breakfast.     No facility-administered medications prior to visit.    PAST MEDICAL HISTORY: Past Medical History:  Diagnosis Date   Allergy    Anemia    Blood transfusion without reported diagnosis    Cataract    Cervical pain (neck)    Headache    HLD (hyperlipidemia)    Hypertension    PONV (postoperative nausea and vomiting)    Sinus disease    Varicose veins     PAST SURGICAL HISTORY: Past Surgical History:  Procedure Laterality Date   ABDOMINAL HYSTERECTOMY     partial   BIOPSY  02/14/2018   Procedure: BIOPSY;  Surgeon: Lemar Lofty., MD;  Location: [facility];  Service: Gastroenterology;;   BREAST REDUCTION SURGERY  2001   COLONOSCOPY     ESOPHAGOGASTRODUODENOSCOPY N/A 02/14/2018   Procedure: ESOPHAGOGASTRODUODENOSCOPY (EGD);  Surgeon: Lemar Lofty., MD;  Location: [facility];  Service: Gastroenterology;  Laterality: N/A;   REDUCTION MAMMAPLASTY Bilateral  2002   ROTATOR CUFF REPAIR Right    UPPER GASTROINTESTINAL ENDOSCOPY      FAMILY HISTORY: Family History  Problem Relation Age of Onset   Cancer Mother    Diabetes Mother    Hypertension Mother    Esophageal cancer Mother    Cancer Father    Hyperlipidemia Father    Hypertension Father    Varicose Veins Father    Colon polyps Brother    Breast cancer Neg Hx    Colon cancer Neg Hx    Inflammatory bowel disease Neg Hx    Liver disease Neg Hx    Pancreatic cancer Neg Hx    Rectal cancer Neg Hx    Stomach cancer Neg Hx     SOCIAL HISTORY: Social History   Socioeconomic History   Marital status: Married    Spouse name: Not on file   Number of children: Not on file   Years of education: Not on file   Highest education level: Not on file  Occupational History   Not on file  Tobacco Use   Smoking status: Never   Smokeless tobacco: Never  Vaping Use   Vaping Use: Never used  Substance and Sexual Activity   Alcohol use: Yes    Comment: occasional    Drug use: Never   Sexual activity: Not on file  Other Topics Concern   Not on file  Social History Narrative   Not on file   Social Determinants of Health   Financial Resource Strain: Not on file  Food Insecurity: Not on file  Transportation Needs: Not on file  Physical Activity: Not on file  Stress: Not on file  Social Connections: Not on file  Intimate Partner Violence: Not on file      PHYSICAL EXAM  Vitals:   12/18/20 1054  BP: 134/85  Pulse: 76  Weight: 167 lb 6.4 oz (75.9 kg)  Height: 5\' 4"  (1.626 m)    Body mass index is 28.73 kg/m.  Generalized: Well developed,  in no acute distress  Cardiology: normal rate and rhythm, no murmur noted Respiratory: clear to auscultation bilaterally  Neurological examination  Mentation: Alert oriented to time, place, history taking. Follows all commands speech and language fluent Cranial nerve II-XII: Pupils were equal round reactive to light. Extraocular  movements were full, visual field were full  Motor: The motor testing reveals 5 over 5 strength of all 4 extremities. Good symmetric motor tone is noted throughout.  Gait and station: Gait is normal.   DIAGNOSTIC DATA (LABS, IMAGING, TESTING) - I reviewed patient records, labs, notes, testing and imaging myself where available.  No flowsheet data found.   Lab Results  Component Value Date   WBC 6.9 01/13/2019   HGB 13.5 01/13/2019   HCT 40.4 01/13/2019   MCV 95.7 01/13/2019   PLT 277.0 01/13/2019      Component Value Date/Time   NA 141 02/14/2018 0550   K 4.0 02/14/2018 0550   CL 109 02/14/2018 0550   CO2 24 02/14/2018 0550   GLUCOSE 105 (H) 02/14/2018 0550   BUN 14 02/14/2018 0550   CREATININE 0.63 02/14/2018 0550   CALCIUM 8.2 (L) 02/14/2018 0550   PROT 6.2 (L) 02/13/2018 1306   ALBUMIN 3.8 02/13/2018 1306   AST 25 02/13/2018 1306   ALT 19 02/13/2018 1306   ALKPHOS 56 02/13/2018 1306   BILITOT 0.6 02/13/2018 1306   GFRNONAA >60 02/14/2018 0550   GFRAA >60 02/14/2018 0550   No results found for: CHOL, HDL, LDLCALC, LDLDIRECT, TRIG, CHOLHDL No results found for: VOHY0V Lab Results  Component Value Date   VITAMINB12 >1500 (H) 04/01/2018   No results found for: TSH     ASSESSMENT AND PLAN 75 y.o. year old female  has a past medical history of Allergy, Anemia, Blood transfusion without reported diagnosis, Cataract, Cervical pain (neck), Headache, HLD (hyperlipidemia), Hypertension, PONV (postoperative nausea and vomiting), Sinus disease, and Varicose veins. here with     ICD-10-CM   1. OSA on CPAP  G47.33 For home use only DME continuous positive airway pressure (CPAP)   Z99.89        [name] is doing very well on CPAP therapy.  Compliance report reveals optimal compliance.  We have discussed concerns of elevated leak.  She will continue to monitor at home. Apnea is well managed.  She was encouraged to continue using CPAP nightly and for greater than 4 hours  each night.  Healthy lifestyle habits encouraged.  She will follow-up with me in 1 year, sooner if needed.  She verbalizes understanding and agreement with this plan.   Orders Placed This Encounter  Procedures   For home use only DME continuous positive airway pressure (CPAP)    Supplies    Order Specific Question:   Length of Need    Answer:   Lifetime    Order Specific Question:   Patient has OSA or probable OSA    Answer:   Yes    Order Specific Question:   Is the patient currently using CPAP in the home    Answer:   Yes    Order Specific Question:   Settings    Answer:   Other see comments    Order Specific Question:   CPAP supplies needed    Answer:   Mask, headgear, cushions, filters, heated tubing and water chamber      No orders of the defined types were placed in this encounter.     Kassadee Carawan, FNP-C 12/18/2020, 11:39 AM  [facility] [address] [place], [place] [address] [phone]

## 2020-12-14 NOTE — Patient Instructions (Addendum)

## 2020-12-18 ENCOUNTER — Other Ambulatory Visit: Payer: Self-pay

## 2020-12-18 ENCOUNTER — Encounter: Payer: Self-pay | Admitting: Family Medicine

## 2020-12-18 ENCOUNTER — Ambulatory Visit (INDEPENDENT_AMBULATORY_CARE_PROVIDER_SITE_OTHER): Payer: Medicare Other | Admitting: Family Medicine

## 2020-12-18 VITALS — BP 134/85 | HR 76 | Ht 64.0 in | Wt 167.4 lb

## 2020-12-18 DIAGNOSIS — Z9989 Dependence on other enabling machines and devices: Secondary | ICD-10-CM

## 2020-12-18 DIAGNOSIS — G4733 Obstructive sleep apnea (adult) (pediatric): Secondary | ICD-10-CM

## 2020-12-18 NOTE — Progress Notes (Signed)
Faxed CPAP order to [facility] at [phone] and received fax confirmation.

## 2021-03-01 ENCOUNTER — Other Ambulatory Visit: Payer: Self-pay | Admitting: Registered Nurse

## 2021-03-01 DIAGNOSIS — M545 Low back pain, unspecified: Secondary | ICD-10-CM

## 2021-03-02 ENCOUNTER — Other Ambulatory Visit: Payer: Self-pay | Admitting: Internal Medicine

## 2021-03-02 DIAGNOSIS — M545 Low back pain, unspecified: Secondary | ICD-10-CM

## 2021-03-31 ENCOUNTER — Other Ambulatory Visit: Payer: Self-pay

## 2021-03-31 ENCOUNTER — Ambulatory Visit
Admission: RE | Admit: 2021-03-31 | Discharge: 2021-03-31 | Disposition: A | Discharge status: Home / Self Care | Payer: Medicare Other | Source: Ambulatory Visit | Attending: Internal Medicine | Admitting: Internal Medicine

## 2021-03-31 DIAGNOSIS — M545 Low back pain, unspecified: Secondary | ICD-10-CM

## 2021-04-03 ENCOUNTER — Ambulatory Visit
Admission: RE | Admit: 2021-04-03 | Discharge: 2021-04-03 | Disposition: A | Discharge status: Home / Self Care | Payer: Medicare Other | Source: Ambulatory Visit | Attending: Internal Medicine | Admitting: Internal Medicine

## 2021-04-03 ENCOUNTER — Other Ambulatory Visit: Payer: Self-pay

## 2021-06-11 ENCOUNTER — Other Ambulatory Visit: Payer: Self-pay

## 2021-06-11 ENCOUNTER — Ambulatory Visit (INDEPENDENT_AMBULATORY_CARE_PROVIDER_SITE_OTHER): Payer: Medicare Other | Admitting: Podiatry

## 2021-06-11 ENCOUNTER — Ambulatory Visit (INDEPENDENT_AMBULATORY_CARE_PROVIDER_SITE_OTHER): Payer: Medicare Other

## 2021-06-11 ENCOUNTER — Encounter: Payer: Self-pay | Admitting: Podiatry

## 2021-06-11 DIAGNOSIS — M2042 Other hammer toe(s) (acquired), left foot: Secondary | ICD-10-CM | POA: Diagnosis not present

## 2021-06-11 DIAGNOSIS — L84 Corns and callosities: Secondary | ICD-10-CM | POA: Diagnosis not present

## 2021-06-11 NOTE — Progress Notes (Signed)
?  Subjective:  ?Patient ID: Dana Mcgrath, female    DOB: [birth year],  MRN: [id number] ? ?Chief Complaint  ?Patient presents with  ? Callouses  ?    little to on right foot pain/ corns and calluses/hx of arthritis right foot/ankle  ? ? ?76 y.o. female presents with the above complaint. History confirmed with patient.  She returns for follow-up of recurrent callus formation on the fifth toe on the left side, she previously had a "bone spur" removed here previously by another podiatrist in [place] ? ?Objective:  ?Physical Exam: ?warm, good capillary refill, no trophic changes or ulcerative lesions, normal DP and PT pulses, normal sensory exam, and semireducible hammertoe deformities there is callus formation on the medial hallux, to the second toe, lateral fifth toe that is painful with adductovarus rotation of the fifth toe, palpable bony prominence deep to this. ? ?Radiographs: ?Multiple views x-ray of the left foot: Recurrent bone growth of arthroplasty of fifth toe noted ?Assessment:  ? ?1. Hammertoe of left foot   ?2. Callus of foot   ? ? ? ?Plan:  ?Patient was evaluated and treated and all questions answered. ? ?Discussed with her she likely has some recurrent bone growth.  Recommended offloading the overlying skin with silicone padding and using urea cream to soften the hard skin here.  She discharged using a silicone padding and so far seems to be helping.  I discussed with her that we could consider repeat arthroplasty but this could destabilize the toe and ended up with a flail toe or floppy toe.  She will return to see me as needed if she is interested in surgical correction. ?Return if symptoms worsen or fail to improve.  ? ?

## 2021-07-24 ENCOUNTER — Other Ambulatory Visit: Payer: Self-pay | Admitting: Internal Medicine

## 2021-07-24 DIAGNOSIS — Z1231 Encounter for screening mammogram for malignant neoplasm of breast: Secondary | ICD-10-CM

## 2021-08-16 ENCOUNTER — Ambulatory Visit
Admission: RE | Admit: 2021-08-16 | Discharge: 2021-08-16 | Disposition: A | Discharge status: Home / Self Care | Payer: Medicare Other | Source: Ambulatory Visit | Attending: Internal Medicine | Admitting: Internal Medicine

## 2021-08-16 DIAGNOSIS — Z1231 Encounter for screening mammogram for malignant neoplasm of breast: Secondary | ICD-10-CM

## 2021-09-25 ENCOUNTER — Other Ambulatory Visit (HOSPITAL_COMMUNITY): Payer: Self-pay | Admitting: *Deleted

## 2021-09-26 ENCOUNTER — Encounter (HOSPITAL_COMMUNITY)
Admission: RE | Admit: 2021-09-26 | Discharge: 2021-09-26 | Disposition: A | Discharge status: Home / Self Care | Payer: Medicare Other | Source: Ambulatory Visit | Attending: Internal Medicine | Admitting: Internal Medicine

## 2021-09-26 DIAGNOSIS — M810 Age-related osteoporosis without current pathological fracture: Secondary | ICD-10-CM | POA: Diagnosis present

## 2021-09-26 MED ORDER — ZOLEDRONIC ACID 5 MG/100ML IV SOLN: 5.0000 mg | Freq: Once | INTRAVENOUS | Status: AC

## 2021-09-26 MED ORDER — ZOLEDRONIC ACID 5 MG/100ML IV SOLN
INTRAVENOUS | Status: AC
  Administered 2021-09-26: 5 mg via INTRAVENOUS
  Filled 2021-09-26: qty 100

## 2021-12-17 NOTE — Progress Notes (Unsigned)
PATIENT: Dana Mcgrath DOB: October 23, 1945  REASON FOR VISIT: follow up HISTORY FROM: patient  No chief complaint on file.   HISTORY OF PRESENT ILLNESS:  12/17/21 ALL: [name] returns for follow up for OSA on CPAP. She continues to do well on therapy. She is using CPAP nightly for about 6-7 hours.     12/18/2020 ALL: [name] returns for follow up for OSA on CPAP. She reports that she is doing well. She is using CPAP most every night. She admits that she will skip a night from time to time. She may not use for the full 4 hours at times due to back pain with lying in bed. She has to get up and move around and does not always restart CPAP. She feels that she is sleeping well. No concerns with machine or supplies.     08/18/2019 ALL:  Dana Mcgrath is a 76 y.o. female here today for follow up for OSA on CPAP.  She is doing very well with CPAP at home.  She has adjusted to daily usage.  She does continue to note an air leak but continues to work on that.  Otherwise she is feeling well.  She does note improvement in sleep quality with CPAP therapy.  Compliance report dated 07/18/2019 through 08/16/2019 reveals that she use CPAP every night for compliance of 100%.  She is CPAP greater than 4 hours every night for compliance of 100%.  Average usage was 6 hours and 38 minutes.  Residual AHI was 1.4 on 5 to 12 cm of water and an EPR of 3.  There was a significant leak noted in the 95th percentile of 24.7 L/min.  HISTORY: (copied from my note on 03/25/2019)  Dana Mcgrath is a 76 y.o. female here today for follow up.  She continues CPAP therapy.  She admits over the past 30 days she has not been as consistent with usage.  States that she has not gotten in the habit of using it every night.  She denies difficulty with her machine.  She is uncertain if she feels any better when using it.   Compliance report dated 02/22/2019 through 03/23/2019 reveals that she used CPAP 16 of the last 30 days  for compliance of 53%.  13 of the last 30 days she used CPAP greater than 4 hours.  Average usage on days used was 5 hours and 23 minutes.  Residual AHI was 3.8 on 5 to 12 cm of water and an EPR of 3.  There was a leak noted in the 95th percentile of 22.7.   HISTORY: (copied from Dana Mcgrath's note on 11/17/2018)   Update 11/17/2018: [name] is a 76 year old female who is being seen today for initial CPAP compliance visit. CPAP compliance report from 09/16/2018-10/16/2018 with 30 out of 31 usage days with 26 days greater than 4 hours showing 84% compliance.  Average usage 5 hours and 55 minutes with residual AHI 2.4.  Leaks in the 95th percentile 20.  Pressure in the 95th percentile of 8.6 on min pressure 5 and max pressure 12 with EPR level 3.  She was diagnosed with COVID-19 coronavirus on 09/29/2018 after presenting to urgent care on 09/25/2018 with symptoms and exposure from her husband who was tested positive.  She was able to continue use of CPAP for an additional 1-2 weeks but started having difficulty with sinus drainage causing difficulty tolerating CPAP.  She did trial use of CPAP intermittently throughout August but unable  to tolerate.  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  She had only recently discontinued steroids approximately 1 week ago and feels as though she has slowly returning to her baseline.  She has since recently restarted use of CPAP and has since changed supplies.  She continues to follow with [facility] for needed supplies.  Epworth sleepiness scale 1 which was comparable to prior visit.  She does endorse recent weight loss with today's weight 161 pounds (prior 168 pounds).  No further concerns at this time.     Interval history by Dr. Vickey Huger from prior visits for reference purposes only:  The patient underwent a sleep  study on 22 June 2018, she was diagnosed with severe obstructive sleep apnea at an AHI of 33.6 with strong accentuation during REM sleep the REM AHI was 53/h there was no clinically relevant hypoxemia noted and the patient was advised that CPAP would be usually the treatment of choice for this distribution of apneas.  Since she has nasal patency problems and chronic sinusitis which has not responded to nasal sprays alone she had also presented to Dr. Christia Reading at [facility].  Her encounter there was on 02 September 2018 just yesterday and he reports that she had nasal discharge that is usually white and thick she has not been able to start treatment for sleep apnea.  He lists her current outpatient medication, diagnosed with chronic sphenoid sinusitis, turbinate hypertrophy and obstructive sleep apnea and discussed the previous CT sinus findings.  He recommended cent[facility] to me that this may not improve nasal patency at all.  For this reason he also recommended a turbinate reduction.  He wanted to initiate CPAP therapy prior to surgery.  She will get in contact with him once we have started CPAP therapy and I will order auto titration now from my end.  Unfortunately I have been very great difficulties connecting with the patient.  My nurse had done an intake exam over the phone she endorsed the Epworth Sleepiness Scale at 1 point, fatigue severity score was not requested, I will place an order for auto CPAP based on Dr. Jenne Pane conversation with the patient.  Revisit in 2 months with NP     REVIEW OF SYSTEMS: Out of a complete 14 system review of symptoms, the patient complains only of the following symptoms, back pain and all other reviewed systems are negative.  ESS: 2  ALLERGIES: Allergies  Allergen Reactions   Aspirin Other (See Comments)    Bleeding stomach ulcers   Codeine Nausea And Vomiting   Dexamethasone    Other     Pholcodine causes nausea and vomiting   Prednisone  Palpitations    HOME MEDICATIONS: Outpatient Medications Prior to Visit  Medication Sig Dispense Refill   acetaminophen (TYLENOL) 325 MG tablet Take 2 tablets (650 mg total) by mouth every 6 (six) hours as needed for mild pain (or Fever >/= 101). 30 tablet 0   ALOE VERA CONCENTRATE PO Take by mouth.     Ascorbic Acid (VITAMIN C PO) Take by mouth daily.     Biotin w/ Vitamins C & E (HAIR/SKIN/NAILS PO) Take 1 tablet by mouth daily.     Cholecalciferol (VITAMIN D-3 PO) Take by mouth daily.     fluticasone (FLONASE) 50 MCG/ACT nasal spray Place 2 sprays into both nostrils daily.  11   Multiple Vitamins-Minerals (PRESERVISION AREDS 2 PO) Take by mouth daily.     olmesartan (BENICAR) 20 MG tablet Take 10 mg by mouth daily.      omeprazole (PRILOSEC) 40 MG capsule Take 40 mg by mouth daily.     polyethylene glycol (MIRALAX / GLYCOLAX) packet Take 17 g by mouth daily.     Psyllium (METAMUCIL FIBER PO) Take by mouth.     No facility-administered medications prior to visit.    PAST MEDICAL HISTORY: Past Medical History:  Diagnosis Date   Allergy    Anemia    Blood transfusion without reported diagnosis    Cataract    Cervical pain (neck)    Headache    HLD (hyperlipidemia)    Hypertension    PONV (postoperative nausea and vomiting)    Sinus disease    Varicose veins     PAST SURGICAL HISTORY: Past Surgical History:  Procedure Laterality Date   ABDOMINAL HYSTERECTOMY     partial   BIOPSY  02/14/2018   Procedure: BIOPSY;  Surgeon: Lemar Lofty., MD;  Location: [facility];  Service: Gastroenterology;;   BREAST REDUCTION SURGERY  2001   COLONOSCOPY     ESOPHAGOGASTRODUODENOSCOPY N/A 02/14/2018   Procedure: ESOPHAGOGASTRODUODENOSCOPY (EGD);  Surgeon: Lemar Lofty., MD;  Location: [facility];  Service: Gastroenterology;  Laterality: N/A;   REDUCTION MAMMAPLASTY Bilateral 2002   ROTATOR CUFF REPAIR Right    UPPER GASTROINTESTINAL ENDOSCOPY      FAMILY  HISTORY: Family History  Problem Relation Age of Onset   Cancer Mother    Diabetes Mother    Hypertension Mother    Esophageal cancer Mother    Cancer Father    Hyperlipidemia Father    Hypertension Father    Varicose Veins Father    Colon polyps Brother    Breast cancer Neg Hx    Colon cancer Neg Hx    Inflammatory bowel disease Neg Hx    Liver disease Neg Hx    Pancreatic cancer Neg Hx    Rectal cancer Neg Hx    Stomach cancer Neg Hx     SOCIAL HISTORY: Social History   Socioeconomic History   Marital status: Married    Spouse name: Not on file   Number of children: Not on file   Years of education: Not on file   Highest education level: Not on file  Occupational History   Not on file  Tobacco Use   Smoking status: Never   Smokeless tobacco: Never  Vaping Use   Vaping Use: Never used  Substance and Sexual Activity   Alcohol use: Yes    Comment: occasional    Drug use: Never   Sexual activity: Not on file  Other Topics Concern   Not on file  Social History Narrative   Not on file   Social Determinants of Health   Financial Resource Strain: Not on file  Food Insecurity: Not on file  Transportation Needs: Not on file  Physical Activity: Not on file  Stress: Not on file  Social Connections: Not on file  Intimate Partner Violence: Not on file      PHYSICAL EXAM  There were no vitals filed for this visit.   There is no height or weight on file to calculate BMI.  Generalized: Well developed, in no acute distress  Cardiology: normal rate and rhythm, no murmur noted Respiratory: clear to auscultation bilaterally  Neurological examination  Mentation: Alert oriented to time, place, history  taking. Follows all commands speech and language fluent Cranial nerve II-XII: Pupils were equal round reactive to light. Extraocular movements were full, visual field were full  Motor: The motor testing reveals 5 over 5 strength of all 4 extremities. Good symmetric  motor tone is noted throughout.  Gait and station: Gait is normal.   DIAGNOSTIC DATA (LABS, IMAGING, TESTING) - I reviewed patient records, labs, notes, testing and imaging myself where available.      No data to display           Lab Results  Component Value Date   WBC 6.9 01/13/2019   HGB 13.5 01/13/2019   HCT 40.4 01/13/2019   MCV 95.7 01/13/2019   PLT 277.0 01/13/2019      Component Value Date/Time   NA 141 02/14/2018 0550   K 4.0 02/14/2018 0550   CL 109 02/14/2018 0550   CO2 24 02/14/2018 0550   GLUCOSE 105 (H) 02/14/2018 0550   BUN 14 02/14/2018 0550   CREATININE 0.63 02/14/2018 0550   CALCIUM 8.2 (L) 02/14/2018 0550   PROT 6.2 (L) 02/13/2018 1306   ALBUMIN 3.8 02/13/2018 1306   AST 25 02/13/2018 1306   ALT 19 02/13/2018 1306   ALKPHOS 56 02/13/2018 1306   BILITOT 0.6 02/13/2018 1306   GFRNONAA >60 02/14/2018 0550   GFRAA >60 02/14/2018 0550   No results found for: "CHOL", "HDL", "LDLCALC", "LDLDIRECT", "TRIG", "CHOLHDL" No results found for: "HGBA1C" Lab Results  Component Value Date   VITAMINB12 >1500 (H) 04/01/2018   No results found for: "TSH"     ASSESSMENT AND PLAN 76 y.o. year old female  has a past medical history of Allergy, Anemia, Blood transfusion without reported diagnosis, Cataract, Cervical pain (neck), Headache, HLD (hyperlipidemia), Hypertension, PONV (postoperative nausea and vomiting), Sinus disease, and Varicose veins. here with   No diagnosis found.    Ms. Freda Munro is doing very well on CPAP therapy.  Compliance report reveals optimal compliance.  We have discussed concerns of elevated leak.  She will continue to monitor at home. Apnea is well managed.  She was encouraged to continue using CPAP nightly and for greater than 4 hours each night.  Healthy lifestyle habits encouraged.  She will follow-up with me in 1 year, sooner if needed.  She verbalizes understanding and agreement with this plan.   No orders of the defined types  were placed in this encounter.     No orders of the defined types were placed in this encounter.      Debbora Presto, FNP-C 12/17/2021, 4:43 PM [facility] [address], [place], [place] [address] [phone]

## 2021-12-17 NOTE — Patient Instructions (Signed)

## 2021-12-19 ENCOUNTER — Ambulatory Visit (INDEPENDENT_AMBULATORY_CARE_PROVIDER_SITE_OTHER): Payer: Medicare Other | Admitting: Family Medicine

## 2021-12-19 ENCOUNTER — Encounter: Payer: Self-pay | Admitting: Family Medicine

## 2021-12-19 VITALS — BP 133/72 | HR 78 | Ht 64.0 in | Wt 162.6 lb

## 2021-12-19 DIAGNOSIS — G4733 Obstructive sleep apnea (adult) (pediatric): Secondary | ICD-10-CM | POA: Diagnosis not present

## 2021-12-20 ENCOUNTER — Telehealth: Payer: Self-pay

## 2022-08-19 ENCOUNTER — Other Ambulatory Visit (HOSPITAL_COMMUNITY): Payer: Self-pay

## 2022-08-21 ENCOUNTER — Encounter (HOSPITAL_COMMUNITY): Payer: Medicare Other

## 2022-08-21 ENCOUNTER — Other Ambulatory Visit: Payer: Self-pay | Admitting: Internal Medicine

## 2022-08-21 DIAGNOSIS — Z1231 Encounter for screening mammogram for malignant neoplasm of breast: Secondary | ICD-10-CM

## 2022-09-11 ENCOUNTER — Ambulatory Visit
Admission: RE | Admit: 2022-09-11 | Discharge: 2022-09-11 | Disposition: A | Discharge status: Home / Self Care | Payer: Medicare Other | Source: Ambulatory Visit | Attending: Internal Medicine | Admitting: Internal Medicine

## 2022-09-11 ENCOUNTER — Ambulatory Visit: Payer: Medicare Other

## 2022-09-11 DIAGNOSIS — Z1231 Encounter for screening mammogram for malignant neoplasm of breast: Secondary | ICD-10-CM

## 2022-09-27 ENCOUNTER — Other Ambulatory Visit (HOSPITAL_COMMUNITY): Payer: Self-pay | Admitting: *Deleted

## 2022-09-27 DIAGNOSIS — M810 Age-related osteoporosis without current pathological fracture: Secondary | ICD-10-CM

## 2022-10-01 ENCOUNTER — Inpatient Hospital Stay (HOSPITAL_COMMUNITY): Admission: RE | Admit: 2022-10-01 | Payer: Medicare Other | Source: Ambulatory Visit

## 2022-10-11 ENCOUNTER — Ambulatory Visit (HOSPITAL_COMMUNITY)
Admission: RE | Admit: 2022-10-11 | Discharge: 2022-10-11 | Disposition: A | Discharge status: Home / Self Care | Payer: Medicare Other | Source: Ambulatory Visit | Attending: Internal Medicine | Admitting: Internal Medicine

## 2022-10-11 DIAGNOSIS — M810 Age-related osteoporosis without current pathological fracture: Secondary | ICD-10-CM

## 2022-10-11 LAB — CREATININE, SERUM
Creatinine, Ser: 0.68 mg/dL (ref 0.44–1.00)
GFR, Estimated: >60 mL/min (ref >60)

## 2022-10-11 LAB — CALCIUM: Calcium: 9 mg/dL (ref 8.9–10.3)

## 2022-10-11 MED ORDER — ZOLEDRONIC ACID 5 MG/100ML IV SOLN
5.0000 mg | Freq: Once | INTRAVENOUS | Status: AC
  Administered 2022-10-11: 5 mg via INTRAVENOUS

## 2022-10-11 MED ORDER — ZOLEDRONIC ACID 5 MG/100ML IV SOLN
INTRAVENOUS | Status: AC
  Filled 2022-10-11: qty 100

## 2022-12-19 NOTE — Progress Notes (Addendum)
 PATIENT: Dana Mcgrath DOB: 06-17-1945  REASON FOR VISIT: follow up HISTORY FROM: patient  Chief Complaint  Patient presents with   Room 1    Pt is here with her Husband. Pt states that the door on her filter is broken due to the puppy biting it.     HISTORY OF PRESENT ILLNESS:  12/24/22 ALL: Dana Mcgrath returns for follow up for OSA on CPAP. She continues to do well on therapy. She is using CPAP nightly for about 5-6 hours, on average. She denies concerns with her machine but does report the door covering the filter is broken. No concerns with supplies. She is eligible for a new machine 08/2023.     12/19/2021 ALL: Dana Mcgrath returns for follow up for OSA on CPAP. She continues to do well on therapy. She is using CPAP nightly for about 6-7 hours. She is doing well on therapy. She is using a nasal pillow and notes that newer masks and headgear seem to be made a little differently and push her ear downward. She has noted some leaking but able to reposition.     12/18/2020 ALL: Dana Mcgrath returns for follow up for OSA on CPAP. She reports that she is doing well. She is using CPAP most every night. She admits that she will skip a night from time to time. She may not use for the full 4 hours at times due to back pain with lying in bed. She has to get up and move around and does not always restart CPAP. She feels that she is sleeping well. No concerns with machine or supplies.     08/18/2019 ALL:  Dana Mcgrath is a 77 y.o. female here today for follow up for OSA on CPAP.  She is doing very well with CPAP at home.  She has adjusted to daily usage.  She does continue to note an air leak but continues to work on that.  Otherwise she is feeling well.  She does note improvement in sleep quality with CPAP therapy.  Compliance report dated 07/18/2019 through 08/16/2019 reveals that she use CPAP every night for compliance of 100%.  She is CPAP greater than 4 hours every night for compliance of  100%.  Average usage was 6 hours and 38 minutes.  Residual AHI was 1.4 on 5 to 12 cm of water and an EPR of 3.  There was a significant leak noted in the 95th percentile of 24.7 L/min.  HISTORY: (copied from my note on 03/25/2019)  Dana Mcgrath is a 77 y.o. female here today for follow up.  She continues CPAP therapy.  She admits over the past 30 days she has not been as consistent with usage.  States that she has not gotten in the habit of using it every night.  She denies difficulty with her machine.  She is uncertain if she feels any better when using it.   Compliance report dated 02/22/2019 through 03/23/2019 reveals that she used CPAP 16 of the last 30 days for compliance of 53%.  13 of the last 30 days she used CPAP greater than 4 hours.  Average usage on days used was 5 hours and 23 minutes.  Residual AHI was 3.8 on 5 to 12 cm of water and an EPR of 3.  There was a leak noted in the 95th percentile of 22.7.   HISTORY: (copied from Dana Mcgrath's note on 11/17/2018)   Update 11/17/2018: Dana Mcgrath is a 77 year old female who is  being seen today for initial CPAP compliance visit. CPAP compliance report from 09/16/2018-10/16/2018 with 30 out of 31 usage days with 26 days greater than 4 hours showing 84% compliance.  Average usage 5 hours and 55 minutes with residual AHI 2.4.  Leaks in the 95th percentile 20.  Pressure in the 95th percentile of 8.6 on min pressure 5 and max pressure 12 with EPR level 3.  She was diagnosed with COVID-19 coronavirus on 09/29/2018 after presenting to urgent care on 09/25/2018 with symptoms and exposure from her husband who was tested positive.  She was able to continue use of CPAP for an additional 1-2 weeks but started having difficulty with sinus drainage causing difficulty tolerating CPAP.  She did trial use of CPAP intermittently throughout August but unable to tolerate.  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  She had only recently discontinued steroids approximately 1 week ago and feels as though she has slowly returning to her baseline.  She has since recently restarted use of CPAP and has since changed supplies.  She continues to follow with [facility] for needed supplies.  Epworth sleepiness scale 1 which was comparable to prior visit.  She does endorse recent weight loss with today's weight 161 pounds (prior 168 pounds).  No further concerns at this time.     Interval history by Dr. Vickey Mcgrath from prior visits for reference purposes only:  The patient underwent a sleep study on 22 June 2018, she was diagnosed with severe obstructive sleep apnea at an AHI of 33.6 with strong accentuation during REM sleep the REM AHI was 53/h there was no clinically relevant hypoxemia noted and the patient was advised that CPAP would be usually the treatment of choice for this distribution of apneas.  Since she has nasal patency problems and chronic sinusitis which has not responded to nasal sprays alone she had also presented to Dr. Christia Reading at [facility].  Her encounter there was on 02 September 2018 just yesterday and he reports that she had nasal discharge that is usually white and thick she has not been able to start treatment for sleep apnea.  He lists her current outpatient medication, diagnosed with chronic sphenoid sinusitis, turbinate hypertrophy and obstructive sleep apnea and discussed the previous CT sinus findings.  He recommended [facility] to me that this may not improve nasal patency at all.  For this reason he also recommended a turbinate reduction.  He wanted to initiate CPAP therapy prior to surgery.  She will get in contact with him once we have started CPAP therapy and I will order auto titration now from my end.  Unfortunately I have been very great difficulties connecting with the patient.   My nurse had done an intake exam over the phone she endorsed the Epworth Sleepiness Scale at 1 point, fatigue severity score was not requested, I will place an order for auto CPAP based on Dr. Jenne Pane conversation with the patient.  Revisit in 2 months with NP     REVIEW OF SYSTEMS: Out of a complete 14 system review of symptoms, the patient complains only of the following symptoms, back pain and all other reviewed systems are negative.  ESS: 0/24, 1/24  ALLERGIES: Allergies  Allergen Reactions   Aspirin Other (See Comments)    Bleeding stomach ulcers   Codeine Nausea And Vomiting   Dexamethasone  Other     Pholcodine causes nausea and vomiting   Prednisone Palpitations    HOME MEDICATIONS: Outpatient Medications Prior to Visit  Medication Sig Dispense Refill   acetaminophen (TYLENOL) 325 MG tablet Take 2 tablets (650 mg total) by mouth every 6 (six) hours as needed for mild pain (or Fever >/= 101). 30 tablet 0   ALOE VERA CONCENTRATE PO Take by mouth.     Ascorbic Acid (VITAMIN C PO) Take by mouth daily.     Biotin w/ Vitamins C & E (HAIR/SKIN/NAILS PO) Take 1 tablet by mouth daily.     Cholecalciferol (VITAMIN D-3 PO) Take by mouth daily.     fluticasone (FLONASE) 50 MCG/ACT nasal spray Place 2 sprays into both nostrils daily.   11   Multiple Vitamins-Minerals (PRESERVISION AREDS 2 PO) Take by mouth daily.     olmesartan (BENICAR) 20 MG tablet Take 10 mg by mouth daily.      omeprazole (PRILOSEC) 40 MG capsule Take 40 mg by mouth daily.     polyethylene glycol (MIRALAX / GLYCOLAX) packet Take 17 g by mouth daily.     Psyllium (METAMUCIL FIBER PO) Take by mouth.     No facility-administered medications prior to visit.    PAST MEDICAL HISTORY: Past Medical History:  Diagnosis Date   Allergy    Anemia    Blood transfusion without reported diagnosis    Cataract    Cervical pain (neck)    Headache    HLD (hyperlipidemia)    Hypertension    PONV (postoperative nausea and  vomiting)    Sinus disease    Varicose veins     PAST SURGICAL HISTORY: Past Surgical History:  Procedure Laterality Date   ABDOMINAL HYSTERECTOMY     partial   BIOPSY  02/14/2018   Procedure: BIOPSY;  Surgeon: Lemar Lofty., MD;  Location: [facility];  Service: Gastroenterology;;   BREAST REDUCTION SURGERY  2001   COLONOSCOPY     ESOPHAGOGASTRODUODENOSCOPY N/A 02/14/2018   Procedure: ESOPHAGOGASTRODUODENOSCOPY (EGD);  Surgeon: Lemar Lofty., MD;  Location: [facility];  Service: Gastroenterology;  Laterality: N/A;   REDUCTION MAMMAPLASTY Bilateral 2002   ROTATOR CUFF REPAIR Right    UPPER GASTROINTESTINAL ENDOSCOPY      FAMILY HISTORY: Family History  Problem Relation Age of Onset   Cancer Mother    Diabetes Mother    Hypertension Mother    Esophageal cancer Mother    Cancer Father    Hyperlipidemia Father    Hypertension Father    Varicose Veins Father    Colon polyps Brother    Breast cancer Neg Hx    Colon cancer Neg Hx    Inflammatory bowel disease Neg Hx    Liver disease Neg Hx    Pancreatic cancer Neg Hx    Rectal cancer Neg Hx    Stomach cancer Neg Hx     SOCIAL HISTORY: Social History   Socioeconomic History   Marital status: Married    Spouse name: Not on file   Number of children: Not on file   Years of education: Not on file   Highest education level: Not on file  Occupational History   Not on file  Tobacco Use   Smoking status: Never   Smokeless tobacco: Never  Vaping Use   Vaping status: Never Used  Substance and Sexual Activity   Alcohol use: Yes    Comment: occasional    Drug use: Never   Sexual activity: Not on  file  Other Topics Concern   Not on file  Social History Narrative   Not on file   Social Determinants of Health   Financial Resource Strain: Not on file  Food Insecurity: Not on file  Transportation Needs: Not on file  Physical Activity: Not on file  Stress: Not on file  Social Connections: Not  on file  Intimate Partner Violence: Not on file      PHYSICAL EXAM  Vitals:   12/24/22 1101  BP: 121/76  Pulse: 62  Weight: 161 lb 8 oz (73.3 kg)  Height: 5\' 4"  (1.626 m)      Body mass index is 27.72 kg/m.  Generalized: Well developed, in no acute distress  Cardiology: normal rate and rhythm, no murmur noted Respiratory: clear to auscultation bilaterally  Neurological examination  Mentation: Alert oriented to time, place, history taking. Follows all commands speech and language fluent Cranial nerve II-XII: Pupils were equal round reactive to light. Extraocular movements were full, visual field were full  Motor: The motor testing reveals 5 over 5 strength of all 4 extremities. Good symmetric motor tone is noted throughout.  Gait and station: Gait is normal.   DIAGNOSTIC DATA (LABS, IMAGING, TESTING) - I reviewed patient records, labs, notes, testing and imaging myself where available.      No data to display           Lab Results  Component Value Date   WBC 6.9 01/13/2019   HGB 13.5 01/13/2019   HCT 40.4 01/13/2019   MCV 95.7 01/13/2019   PLT 277.0 01/13/2019      Component Value Date/Time   NA 141 02/14/2018 0550   K 4.0 02/14/2018 0550   CL 109 02/14/2018 0550   CO2 24 02/14/2018 0550   GLUCOSE 105 (H) 02/14/2018 0550   BUN 14 02/14/2018 0550   CREATININE 0.68 10/11/2022 1006   CALCIUM 9.0 10/11/2022 1006   PROT 6.2 (L) 02/13/2018 1306   ALBUMIN 3.8 02/13/2018 1306   AST 25 02/13/2018 1306   ALT 19 02/13/2018 1306   ALKPHOS 56 02/13/2018 1306   BILITOT 0.6 02/13/2018 1306   GFRNONAA >60 10/11/2022 1006   GFRAA >60 02/14/2018 0550   No results found for: "CHOL", "HDL", "LDLCALC", "LDLDIRECT", "TRIG", "CHOLHDL" No results found for: "HGBA1C" Lab Results  Component Value Date   VITAMINB12 >1500 (H) 04/01/2018   No results found for: "TSH"   ASSESSMENT AND PLAN 77 y.o. year old female  has a past medical history of Allergy, Anemia, Blood  transfusion without reported diagnosis, Cataract, Cervical pain (neck), Headache, HLD (hyperlipidemia), Hypertension, PONV (postoperative nausea and vomiting), Sinus disease, and Varicose veins. here with     ICD-10-CM   1. OSA on CPAP  G47.33 For home use only DME continuous positive airway pressure (CPAP)        [name] is doing very well on CPAP therapy.  Compliance report reveals excellent daily and optimal 4 hour compliance. She was encouraged to continue using CPAP nightly and for greater than 4 hours each night.  Healthy lifestyle habits encouraged.  She will follow-up with me in 1 year, sooner if needed.  She verbalizes understanding and agreement with this plan.   Orders Placed This Encounter  Procedures   For home use only DME continuous positive airway pressure (CPAP)    Supplies    Order Specific Question:   Length of Need    Answer:   Lifetime    Order Specific Question:  Patient has OSA or probable OSA    Answer:   Yes    Order Specific Question:   Is the patient currently using CPAP in the home    Answer:   Yes    Order Specific Question:   Settings    Answer:   Other see comments    Order Specific Question:   CPAP supplies needed    Answer:   Mask, headgear, cushions, filters, heated tubing and water chamber      No orders of the defined types were placed in this encounter.      Shawnie Dapper, FNP-C 12/24/2022, 11:31 AM [facility] [address] [place], [place] [address] [phone]

## 2022-12-19 NOTE — Patient Instructions (Signed)

## 2022-12-24 ENCOUNTER — Encounter: Payer: Self-pay | Admitting: Family Medicine

## 2022-12-24 ENCOUNTER — Ambulatory Visit (INDEPENDENT_AMBULATORY_CARE_PROVIDER_SITE_OTHER): Payer: Medicare Other | Admitting: Family Medicine

## 2022-12-24 VITALS — BP 121/76 | HR 62 | Ht 64.0 in | Wt 161.5 lb

## 2022-12-24 DIAGNOSIS — G4733 Obstructive sleep apnea (adult) (pediatric): Secondary | ICD-10-CM

## 2023-02-27 ENCOUNTER — Ambulatory Visit: Payer: Medicare Other | Admitting: Podiatry

## 2023-02-27 DIAGNOSIS — M778 Other enthesopathies, not elsewhere classified: Secondary | ICD-10-CM

## 2023-02-27 MED ORDER — TRIAMCINOLONE ACETONIDE 10 MG/ML IJ SUSP
10.0000 mg | Freq: Once | INTRAMUSCULAR | Status: AC
  Administered 2023-02-27: 10 mg via INTRA_ARTICULAR

## 2023-02-27 NOTE — Progress Notes (Signed)
Subjective:   Patient ID: Dana Mcgrath, female   DOB: 77 y.o.   MRN: [id number]   HPI Patient presents with a lot of pain in the outside of the right foot and states it has been inflamed   ROS      Objective:  Physical Exam  Neurovascular status intact with inflammation around the base of the fifth metatarsal right fluid buildup noted slight lesion formation     Assessment:  Inflammatory capsulitis base of fifth MPJ right fluid buildup with pain     Plan:  H&P reviewed sterile prep injected the capsule of the fifth metatarsal base 3 mg dexamethasone Kenalog 5 mg Xylocaine and then debrided lesion reappoint as needed

## 2023-04-03 ENCOUNTER — Telehealth: Payer: Self-pay | Admitting: Family Medicine

## 2023-04-03 DIAGNOSIS — G4733 Obstructive sleep apnea (adult) (pediatric): Secondary | ICD-10-CM

## 2023-04-03 NOTE — Telephone Encounter (Signed)
Can we verify her set up date? If eligible, she would like to repeat HST and order new CPAP. She was seen 12/2022. I would like to get HST, if able, without having to bring her back in for follow up. TY!

## 2023-04-07 NOTE — Telephone Encounter (Signed)
Pt's Set up date is 09/10/2018.

## 2023-04-09 ENCOUNTER — Ambulatory Visit: Payer: Medicare Other | Admitting: Podiatry

## 2023-04-23 ENCOUNTER — Encounter: Payer: Self-pay | Admitting: Podiatry

## 2023-04-23 ENCOUNTER — Ambulatory Visit (INDEPENDENT_AMBULATORY_CARE_PROVIDER_SITE_OTHER): Payer: Medicare PPO | Admitting: Podiatry

## 2023-04-23 DIAGNOSIS — M778 Other enthesopathies, not elsewhere classified: Secondary | ICD-10-CM

## 2023-04-23 MED ORDER — TRIAMCINOLONE ACETONIDE 10 MG/ML IJ SUSP
10.0000 mg | Freq: Once | INTRAMUSCULAR | Status: AC
  Administered 2023-04-23: 10 mg via INTRA_ARTICULAR

## 2023-04-24 ENCOUNTER — Encounter: Payer: Self-pay | Admitting: Family Medicine

## 2023-04-24 NOTE — Progress Notes (Signed)
 Subjective:   Patient ID: Dana Mcgrath, female   DOB: 78 y.o.   MRN: [id number]   HPI Patient presents with inflammation pain right foot states it has been getting sore around the bottom of the foot and that she feels like she is walking on it not   ROS      Objective:  Physical Exam  Neuro vascular status intact with inflammation fluid around the fifth MPJ right keratotic lesion formation also in proximity to this with a lucent core     Assessment:  Inflammatory capsulitis of the fifth MPJ right with fluid buildup and lesion secondarily     Plan:  H&P reviewed sterile prep and injected the capsule plantar 3 mg Dexasone Kenalog  5 mg Xylocaine and did a complementary debridement of the lesion today and this did give complete relief.  Reappoint as symptoms indicate may require more aggressive approach at 1 point

## 2023-04-24 NOTE — Addendum Note (Signed)
 Addended by: Terrilyn Fick L on: 04/24/2023 08:26 AM   Modules accepted: Orders

## 2023-05-20 ENCOUNTER — Ambulatory Visit (INDEPENDENT_AMBULATORY_CARE_PROVIDER_SITE_OTHER): Payer: Medicare PPO | Admitting: Neurology

## 2023-05-20 DIAGNOSIS — G4733 Obstructive sleep apnea (adult) (pediatric): Secondary | ICD-10-CM | POA: Diagnosis not present

## 2023-05-20 DIAGNOSIS — K279 Peptic ulcer, site unspecified, unspecified as acute or chronic, without hemorrhage or perforation: Secondary | ICD-10-CM

## 2023-05-20 DIAGNOSIS — D649 Anemia, unspecified: Secondary | ICD-10-CM

## 2023-05-21 NOTE — Progress Notes (Signed)
 [facility]  Dana Mcgrath 78 year old female 02-Sep-1945 Comm Pref:    HOME SLEEP TEST REPORT ( by Watch PAT)   STUDY DATE:   05-20-2023   ORDERING CLINICIAN:  Shawnie Dapper, NP  REFERRING CLINICIAN:  Geoffry Paradise, MD    CLINICAL INFORMATION/HISTORY:  Pt is here with her Husband. Pt states that the door on her filter is broken due to the puppy biting it.       HISTORY OF PRESENT ILLNESS:   12/24/22 ALL: Donald returns for follow up for OSA on CPAP. She continues to do well on therapy. She is using CPAP nightly for about 5-6 hours, on average. She denies concerns with her machine but does report the door covering the filter is broken. No concerns with supplies. [name] is doing very well on CPAP therapy.  Compliance report reveals excellent daily and optimal 4 hour compliance. She was encouraged to continue using CPAP nightly and for greater than 4 hours each night.  Healthy lifestyle habits encouraged.  She will follow-up with me in 1 year, sooner if needed.  She verbalizes understanding and agreement with this plan. She is eligible for a new machine 07/2020.(?)      Epworth sleepiness score: ESS: 2/24,,  FSS :  NA   BMI:  27 kg/m   Neck Circumference: NA   FINDINGS:   Sleep Summary:   Total Recording Time (hours, min):   8 hours 5 minutes  Total Sleep Time (hours, min):   7 hours 12 minutes              Percent REM (%):    18.6/h                                    Respiratory Indices:   Calculated pAHI (per hour):    Following CMS criteria this patient only reached his AHI of 6.8/h which is mild.                         REM pAHI:       6/h                                          NREM pAHI:      7/h                        Positional AHI:    The patient slept almost exclusively in supine for 338 minutes with an AHI of 5/h when using CMS criteria.  Her AHI would have been 14/h if AASM criteria are applied.  This speaks for more hypopnea  being present than frank apneas.  She also slept 94 minutes on her left with an AHI of 13.4/h.  It is unusual to see this distribution.  Snoring level reached a mean volume of 40 dB which is actually at the threshold of detection.   Presumably,snoring was present for 15% of total recorded time.  Oxygen Saturation Statistics:   Oxygen Saturation (%) Mean:   94%               O2 Saturation Range (%):   Between 83 and 98%                                    O2 Saturation (minutes) <89%:       1.5 minutes  Pulse Rate Statistics:   Pulse Mean (bpm):   66 bpm, we only cannot detect the rate but not the cardiac rhythm from these data.              Pulse Range:      Between 54 and 101 bpm           IMPRESSION:  This HST confirms the presence of a very mild all obstructive sleep apnea form.    It is very clear by the difference between CMS and AASM M scoring but the patient would have moderate severe sleep apnea if he would use the old criteria of 3% oxygen desaturation.  By AASM this patient had a REM AHI of 24.7/h and in non-REM sleep AHI of 13.9/h and I have no doubt that she is still in need of CPAP therapy.   RECOMMENDATION: I would like to issue a new auto titration machine with the same settings 5 through 12 cm water pressure 3 cm EPR heated humidification and an interface of patient's choice.  Should Mrs. Dana Mcgrath have developed aerophagia or discomfort with her CPAP therapy, she may benefit from a restriction of the upper pressure settings to 10 cm water.    INTERPRETING PHYSICIAN:   Melvyn Novas, MD

## 2023-05-29 ENCOUNTER — Telehealth: Payer: Self-pay | Admitting: Neurology

## 2023-05-29 DIAGNOSIS — G4733 Obstructive sleep apnea (adult) (pediatric): Secondary | ICD-10-CM

## 2023-05-29 NOTE — Telephone Encounter (Signed)
   HST results : still having apnea, but CMS criteria have pushed the [facility] far down to 6.9/h.   It is very clear by the difference between CMS and AASM  scoring that the patient would have moderate severe sleep apnea if we would use the old criteria of 3% oxygen desaturation.   By AASM , this patient had a REM AHI of 24.7/h and in non-REM sleep AHI of 13.9/h - and I have no doubt that she is still in need of CPAP therapy.   RECOMMENDATION: I would like to issue a new auto titration machine with the same settings 5 through 12 cm water pressure 3 cm EPR heated humidification and an interface of patient's choice.    Should Dana Mcgrath have developed aerophagia or discomfort with her CPAP therapy, she may benefit from a restriction of the upper pressure settings to 10 cm water.    cc Shawnie Dapper, NP

## 2023-05-29 NOTE — Procedures (Signed)
 [facility]  Dana Mcgrath 78 year old female 02-Sep-1945 Comm Pref:    HOME SLEEP TEST REPORT ( by Watch PAT)   STUDY DATE:   05-20-2023   ORDERING CLINICIAN:  Shawnie Dapper, NP  REFERRING CLINICIAN:  Geoffry Paradise, MD    CLINICAL INFORMATION/HISTORY:  Pt is here with her Husband. Pt states that the door on her filter is broken due to the puppy biting it.       HISTORY OF PRESENT ILLNESS:   12/24/22 ALL: Dana Mcgrath returns for follow up for OSA on CPAP. She continues to do well on therapy. She is using CPAP nightly for about 5-6 hours, on average. She denies concerns with her machine but does report the door covering the filter is broken. No concerns with supplies. Dana Mcgrath is doing very well on CPAP therapy.  Compliance report reveals excellent daily and optimal 4 hour compliance. She was encouraged to continue using CPAP nightly and for greater than 4 hours each night.  Healthy lifestyle habits encouraged.  She will follow-up with me in 1 year, sooner if needed.  She verbalizes understanding and agreement with this plan. She is eligible for a new machine 07/2020.(?)      Epworth sleepiness score: ESS: 2/24,,  FSS :  NA   BMI:  27 kg/m   Neck Circumference: NA   FINDINGS:   Sleep Summary:   Total Recording Time (hours, min):   8 hours 5 minutes  Total Sleep Time (hours, min):   7 hours 12 minutes              Percent REM (%):    18.6/h                                    Respiratory Indices:   Calculated pAHI (per hour):    Following CMS criteria this patient only reached his AHI of 6.8/h which is mild.                         REM pAHI:       6/h                                          NREM pAHI:      7/h                        Positional AHI:    The patient slept almost exclusively in supine for 338 minutes with an AHI of 5/h when using CMS criteria.  Her AHI would have been 14/h if AASM criteria are applied.  This speaks for more hypopnea  being present than frank apneas.  She also slept 94 minutes on her left with an AHI of 13.4/h.  It is unusual to see this distribution.  Snoring level reached a mean volume of 40 dB which is actually at the threshold of detection.   Presumably,snoring was present for 15% of total recorded time.  Oxygen Saturation Statistics:   Oxygen Saturation (%) Mean:   94%               O2 Saturation Range (%):   Between 83 and 98%                                    O2 Saturation (minutes) <89%:       1.5 minutes  Pulse Rate Statistics:   Pulse Mean (bpm):   66 bpm, we only cannot detect the rate but not the cardiac rhythm from these data.              Pulse Range:      Between 54 and 101 bpm           IMPRESSION:  This HST confirms the presence of a very mild all obstructive sleep apnea form.    It is very clear by the difference between CMS and AASM M scoring but the patient would have moderate severe sleep apnea if he would use the old criteria of 3% oxygen desaturation.  By AASM this patient had a REM AHI of 24.7/h and in non-REM sleep AHI of 13.9/h and I have no doubt that she is still in need of CPAP therapy.   RECOMMENDATION: I would like to issue a new auto titration machine with the same settings 5 through 12 cm water pressure 3 cm EPR heated humidification and an interface of patient's choice.  Should Mrs. Dana Mcgrath have developed aerophagia or discomfort with her CPAP therapy, she may benefit from a restriction of the upper pressure settings to 10 cm water.    INTERPRETING PHYSICIAN:   Melvyn Novas, MD

## 2023-06-02 NOTE — Telephone Encounter (Signed)
 Called the patient and there was no answer. Left a detailed message advising that Dr Vickey Huger reviewed the results which indicated that she does still have sleep apnea and should continue CPAP therapy as treatment.  The pt is eligible for a new machine but it is my understanding that the only company that accepts [employer], is [employer]. I see on file [facility] was listed as DME company but I wanted to follow up and let her know it is my understanding we have to go through [employer]. I will await a call back to confirm that the pt is ok with me sending order to [employer] [facility] if [facility] is correct.

## 2023-06-02 NOTE — Telephone Encounter (Signed)
 Pt returned call. Please call back when available.

## 2023-06-02 NOTE — Telephone Encounter (Addendum)
 Called patient to discuss the below. Pt said she would like to proceed with [employer] order she does still have [facility] Medicare, message to [employer].

## 2023-06-10 NOTE — Telephone Encounter (Signed)
 Pt called to check what she needs to do about ordering CPAP supplies from [facility]. [facility] keeps calling to order CPAP supplies. Would like a call back.

## 2023-06-10 NOTE — Telephone Encounter (Signed)
 Order that was placed on 05/29/23 was already sent to [facility]. I will forward this response to [facility] so they can ensure, pt is being taken care of

## 2023-06-10 NOTE — Telephone Encounter (Signed)
 Spoke w/Pt regarding getting supplies from [employer] Discussed information sent on 06/02/23 so it is in process and she should be getting a call from them regarding supplies and delivery. Pt reported having plenty of supplies for now. Pt states she has told [facility] she changed companies but they continue to call, discussed ignoring calls if she has already made them aware. Pt stated she had not been told exactly what was revealed by sleep study. Discussed results w/Pt. Pt stated she was very thankful for the call as it has eased her mind.

## 2023-06-10 NOTE — Telephone Encounter (Signed)
 Pt said, [facility] informed can not fill prescription for new CPAP due to needing a new prescription number. Fax to: [phone]

## 2023-06-26 NOTE — Telephone Encounter (Signed)
 Response received from [facility],

## 2023-07-01 NOTE — Telephone Encounter (Signed)
 Pt said would like a call to check on the status of getting CPAP supplies.

## 2023-07-01 NOTE — Telephone Encounter (Signed)
 Cld Pt LVM to call [facility] regarding CPAP supplies and gave [facility] location number.

## 2023-07-09 ENCOUNTER — Ambulatory Visit (INDEPENDENT_AMBULATORY_CARE_PROVIDER_SITE_OTHER): Admitting: Podiatry

## 2023-07-09 ENCOUNTER — Encounter: Payer: Self-pay | Admitting: Podiatry

## 2023-07-09 VITALS — Ht 64.0 in | Wt 161.5 lb

## 2023-07-09 DIAGNOSIS — D2370 Other benign neoplasm of skin of unspecified lower limb, including hip: Secondary | ICD-10-CM

## 2023-07-09 DIAGNOSIS — L600 Ingrowing nail: Secondary | ICD-10-CM

## 2023-07-10 NOTE — Progress Notes (Signed)
  Subjective:  Patient ID: Dana Mcgrath, female    DOB: 27-Jul-1945,  MRN: [id number]  Chief Complaint  Patient presents with   Toe Pain    Pt is here due to left toe pain she states that her toenail is curving inward which is causing some pain and callous on left foot needs to be shaved.    78 y.o. female presents with the above complaint. History confirmed with patient.  She has multiple lesions on both feet the worst of which are the fifth metatarsal base and left submetatarsal 5.  The ingrown can be bothersome its never got infected as far she knows  Objective:  Physical Exam: warm, good capillary refill, no trophic changes or ulcerative lesions, normal DP and PT pulses, normal sensory exam, and incurvated medial hallux left without paronychia mild tenderness to palpation at the distal portion, benign appearing skin lesions submetatarsal 5 and fifth metatarsal base left and right respectively with pain to palpation  Assessment:   1. Ingrowing left great toenail   2. Benign neoplasm of skin of lower extremity, unspecified laterality      Plan:  Patient was evaluated and treated and all questions answered.  We discussed the treatment of ingrowing nail including use of regular pedicures regular nail care and partial permanent matricectomy.  She declined to proceed with matricectomy today and will keep an eye on this and let me know if it worsens and return for the procedure as needed.   Regarding the painful skin lesions we discussed the etiology and treatment of these we discussed the possible etiologies of this could be an intractable porokeratoma possibly a deep involuting verruca.  I recommended destruction of the lesion with sharp debridement and application of a salicylic acid treatment.  She proceeded with this today and the lesion was debrided sharply with a #312 blade and the central core enucleated and salinocaine was applied.  It was dressed with a Band-Aid she will leave  it on for 24 hours.  Risk of recurrence discussed and she will return as needed if they continue to bother her.  Return if symptoms worsen or fail to improve.

## 2023-08-06 ENCOUNTER — Ambulatory Visit (INDEPENDENT_AMBULATORY_CARE_PROVIDER_SITE_OTHER): Admitting: Podiatry

## 2023-08-06 ENCOUNTER — Encounter: Payer: Self-pay | Admitting: Podiatry

## 2023-08-06 VITALS — Ht 64.0 in | Wt 161.0 lb

## 2023-08-06 DIAGNOSIS — M19079 Primary osteoarthritis, unspecified ankle and foot: Secondary | ICD-10-CM

## 2023-08-06 DIAGNOSIS — M2042 Other hammer toe(s) (acquired), left foot: Secondary | ICD-10-CM | POA: Diagnosis not present

## 2023-08-07 NOTE — Progress Notes (Signed)
 Subjective:   Patient ID: Dana Mcgrath, female   DOB: 78 y.o.   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  Currently ankle we will just wear stable shoe gear do not recommend further treatment  X-rays do indicate moderate rotation of the digit and mild changes to the subtalar joint left

## 2023-08-14 ENCOUNTER — Encounter: Payer: Self-pay | Admitting: Family Medicine

## 2023-08-14 ENCOUNTER — Other Ambulatory Visit: Payer: Self-pay | Admitting: Family Medicine

## 2023-08-14 DIAGNOSIS — G4733 Obstructive sleep apnea (adult) (pediatric): Secondary | ICD-10-CM

## 2023-08-26 ENCOUNTER — Telehealth: Payer: Self-pay | Admitting: Family Medicine

## 2023-08-26 NOTE — Telephone Encounter (Signed)
 PT called stating that [facility] is telling  her that Pt needs to put a Debt or Credit card on file to pay for 2 months CPAP supplies . Pt is concern and want to speak to MD  about  this because Pt is aware that insurance cover CPAP and CPAP Supplies

## 2023-08-26 NOTE — Telephone Encounter (Signed)
 I called patient.  She is reluctant to use [facility] because they require a credit/debit card on file for CPAP supplies.  She would prefer to be billed.  She was told this was unable to happen and must have a credit/debit card on file.  I will reach out to [facility] and ask them to call her to discuss his policy further.  I apologized that our office has no insight onto this policy of [facility]'s.  If [facility] cannot resolve this concern, I recommended we check with [facility] or [facility] for patient's request to see if they will take [facility] and have a more lenient credit/debit card policy.  Patient is agreeable to this.  I have reached out to [facility] and asked them to contact patient to discuss.

## 2023-09-16 ENCOUNTER — Telehealth: Payer: Self-pay | Admitting: Family Medicine

## 2023-09-16 NOTE — Telephone Encounter (Signed)
 Pt called to request to get CPAP supplies from different company . Pt states she would like a company that does not require putting a credit card on file the patient prefer to receive bill in mail . Pt states if there is another company she can go to Please give  Pt a call soon .   As of now Pt is with [facility]   Pt states if can't be reach call husband Child psychotherapist) Phone  [phone]

## 2023-09-16 NOTE — Telephone Encounter (Signed)
 Called pt back at [phone]. Currently has [facility]. Plans to switch back to [facility] Medicare 03/2024. She would like to change DME for CPAP. She will call [employer] to see what DME's are in network locally and call back to let us  know which company she would like to change to.  I offered to send a message to [facility] rep to see if they can offer other options for payment. Will ask them to call her to further discuss. She agreed, I sent message, waiting on response.

## 2023-09-25 DIAGNOSIS — G4733 Obstructive sleep apnea (adult) (pediatric): Secondary | ICD-10-CM | POA: Diagnosis not present

## 2023-10-07 DIAGNOSIS — M810 Age-related osteoporosis without current pathological fracture: Secondary | ICD-10-CM | POA: Diagnosis not present

## 2023-10-08 DIAGNOSIS — G4733 Obstructive sleep apnea (adult) (pediatric): Secondary | ICD-10-CM | POA: Diagnosis not present

## 2023-10-14 DIAGNOSIS — M47816 Spondylosis without myelopathy or radiculopathy, lumbar region: Secondary | ICD-10-CM | POA: Diagnosis not present

## 2023-10-14 DIAGNOSIS — Z6827 Body mass index (BMI) 27.0-27.9, adult: Secondary | ICD-10-CM | POA: Diagnosis not present

## 2023-10-26 DIAGNOSIS — G4733 Obstructive sleep apnea (adult) (pediatric): Secondary | ICD-10-CM | POA: Diagnosis not present

## 2023-11-10 ENCOUNTER — Ambulatory Visit (INDEPENDENT_AMBULATORY_CARE_PROVIDER_SITE_OTHER): Admitting: Podiatry

## 2023-11-10 ENCOUNTER — Ambulatory Visit (INDEPENDENT_AMBULATORY_CARE_PROVIDER_SITE_OTHER)

## 2023-11-10 DIAGNOSIS — M7751 Other enthesopathy of right foot: Secondary | ICD-10-CM | POA: Diagnosis not present

## 2023-11-10 DIAGNOSIS — D2370 Other benign neoplasm of skin of unspecified lower limb, including hip: Secondary | ICD-10-CM | POA: Diagnosis not present

## 2023-11-10 DIAGNOSIS — M19071 Primary osteoarthritis, right ankle and foot: Secondary | ICD-10-CM | POA: Diagnosis not present

## 2023-11-10 DIAGNOSIS — D2372 Other benign neoplasm of skin of left lower limb, including hip: Secondary | ICD-10-CM

## 2023-11-10 DIAGNOSIS — D2371 Other benign neoplasm of skin of right lower limb, including hip: Secondary | ICD-10-CM | POA: Diagnosis not present

## 2023-11-10 DIAGNOSIS — M778 Other enthesopathies, not elsewhere classified: Secondary | ICD-10-CM

## 2023-11-10 NOTE — Progress Notes (Signed)
  Subjective:  Patient ID: Dana Mcgrath, female    DOB: 1945-04-26,  MRN: [id number]  Chief Complaint  Patient presents with   Foot Pain    RM 10 Pain in right foot and callus on the bottom of right foot.    78 y.o. female presents with the above complaint. History confirmed with patient.  She has multiple lesions on both feet the worst of which are the fifth metatarsal base right foot and bilateral first submetatarsal and right submetatarsal 5.  She also notes pain and swelling in the foot.  Also still dealing with some of the ingrown toenail  Objective:  Physical Exam: warm, good capillary refill, no trophic changes or ulcerative lesions, normal DP and PT pulses, normal sensory exam, and incurvated medial hallux left without paronychia mild tenderness to palpation at the distal portion, benign appearing skin lesions right foot submetatarsal 5 head and base and first metatarsal, left first submetatarsal and hallux.  Mild pes planus deformity and edema of the midfoot bilateral  Radiographs of right foot taken today show first tarsometatarsal arthritic changes with dorsal spurring  Assessment:   1. Arthritis of foot, right   2. Benign neoplasm of skin of lower extremity, unspecified laterality      Plan:  Patient was evaluated and treated and all questions answered.  Again discussed the ingrown toenail.  She will let me know if it worsens and requires matricectomy.  We reviewed her x-rays and discussed the presence of the midfoot arthritis.  We discussed treatment of this including operative and nonoperative treatment.  I recommended nonoperative treatment for now if it worsens she will return to see me as needed.  Utilize Tylenol  and OTC medications as needed as well as supportive shoe gear.  We discussed appropriate shoes and I recommended she go to the shoe market for this which she has been to we discussed [employer] as a shoe brand as she prefers not to wear athletic type  shoes or sneakers  Regarding the painful skin lesions we discussed the etiology and treatment of these we discussed the possible etiologies of this could be an intractable porokeratoma possibly a deep involuting verruca.  I recommended destruction of the lesion with sharp debridement and application of a salicylic acid treatment.  She proceeded with this today and each lesion was debrided sharply with a #312 blade and the central core enucleated and salinocaine was applied.  It was dressed with a Band-Aid she will leave it on for at least 24 hours.  Risk of recurrence discussed and she will return as needed if they continue to bother her.  Return if symptoms worsen or fail to improve.

## 2023-11-26 DIAGNOSIS — G4733 Obstructive sleep apnea (adult) (pediatric): Secondary | ICD-10-CM | POA: Diagnosis not present

## 2023-12-01 DIAGNOSIS — M47816 Spondylosis without myelopathy or radiculopathy, lumbar region: Secondary | ICD-10-CM | POA: Diagnosis not present

## 2023-12-23 NOTE — Patient Instructions (Signed)

## 2023-12-23 NOTE — Progress Notes (Unsigned)
 SABRA

## 2023-12-23 NOTE — Progress Notes (Unsigned)
 PATIENT: Dana Mcgrath DOB: 1945/08/30  REASON FOR VISIT: follow up HISTORY FROM: patient  No chief complaint on file.   HISTORY OF PRESENT ILLNESS:  12/23/23 ALL: Dana Mcgrath returns for follow up following receipt of new CPAP machine. HST 05/2023 showed mild OSa with AHI of 5/h when using CMS criteria. Her AHI would have been 14/h if AASM criteria are applied. New autoPAP advised. Since, she reports doing well. She is using therapy nightly for about     12/24/2022 ALL:  Dana Mcgrath returns for follow up for OSA on CPAP. She continues to do well on therapy. She is using CPAP nightly for about 5-6 hours, on average. She denies concerns with her machine but does report the door covering the filter is broken. No concerns with supplies. She is eligible for a new machine 08/2023.     12/19/2021 ALL: Dana Mcgrath returns for follow up for OSA on CPAP. She continues to do well on therapy. She is using CPAP nightly for about 6-7 hours. She is doing well on therapy. She is using a nasal pillow and notes that newer masks and headgear seem to be made a little differently and push her ear downward. She has noted some leaking but able to reposition.     12/18/2020 ALL: Dana Mcgrath returns for follow up for OSA on CPAP. She reports that she is doing well. She is using CPAP most every night. She admits that she will skip a night from time to time. She may not use for the full 4 hours at times due to back pain with lying in bed. She has to get up and move around and does not always restart CPAP. She feels that she is sleeping well. No concerns with machine or supplies.     08/18/2019 ALL:  Dana Mcgrath is a 78 y.o. female here today for follow up for OSA on CPAP.  She is doing very well with CPAP at home.  She has adjusted to daily usage.  She does continue to note an air leak but continues to work on that.  Otherwise she is feeling well.  She does note improvement in sleep quality with CPAP  therapy.  Compliance report dated 07/18/2019 through 08/16/2019 reveals that she use CPAP every night for compliance of 100%.  She is CPAP greater than 4 hours every night for compliance of 100%.  Average usage was 6 hours and 38 minutes.  Residual AHI was 1.4 on 5 to 12 cm of water and an EPR of 3.  There was a significant leak noted in the 95th percentile of 24.7 L/min.  HISTORY: (copied from my note on 03/25/2019)  Dana Mcgrath is a 78 y.o. female here today for follow up.  She continues CPAP therapy.  She admits over the past 30 days she has not been as consistent with usage.  States that she has not gotten in the habit of using it every night.  She denies difficulty with her machine.  She is uncertain if she feels any better when using it.   Compliance report dated 02/22/2019 through 03/23/2019 reveals that she used CPAP 16 of the last 30 days for compliance of 53%.  13 of the last 30 days she used CPAP greater than 4 hours.  Average usage on days used was 5 hours and 23 minutes.  Residual AHI was 3.8 on 5 to 12 cm of water and an EPR of 3.  There was a leak noted in the  95th percentile of 22.7.   HISTORY: (copied from Dana Mcgrath's note on 11/17/2018)   Update 11/17/2018: Dana Mcgrath is a 78 year old female who is being seen today for initial CPAP compliance visit. CPAP compliance report from 09/16/2018-10/16/2018 with 30 out of 31 usage days with 26 days greater than 4 hours showing 84% compliance.  Average usage 5 hours and 55 minutes with residual AHI 2.4.  Leaks in the 95th percentile 20.  Pressure in the 95th percentile of 8.6 on min pressure 5 and max pressure 12 with EPR level 3.  She was diagnosed with COVID-19 coronavirus on 09/29/2018 after presenting to urgent care on 09/25/2018 with symptoms and exposure from her husband who was tested positive.  She was able to continue use of CPAP for an additional 1-2 weeks but started having difficulty with sinus drainage causing difficulty tolerating CPAP.   She did trial use of CPAP intermittently throughout August but unable to tolerate.  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  She had only recently discontinued steroids approximately 1 week ago and feels as though she has slowly returning to her baseline.  She has since recently restarted use of CPAP and has since changed supplies.  She continues to follow with [facility] for needed supplies.  Epworth sleepiness scale 1 which was comparable to prior visit.  She does endorse recent weight loss with today's weight 161 pounds (prior 168 pounds).  No further concerns at this time.     Interval history by Dana Mcgrath from prior visits for reference purposes only:  The patient underwent a sleep study on 22 June 2018, she was diagnosed with severe obstructive sleep apnea at an AHI of 33.6 with strong accentuation during REM sleep the REM AHI was 53/h there was no clinically relevant hypoxemia noted and the patient was advised that CPAP would be usually the treatment of choice for this distribution of apneas.  Since she has nasal patency problems and chronic sinusitis which has not responded to nasal sprays alone she had also presented to Dr. Vaughan Mcgrath at [facility].  Her encounter there was on 02 September 2018 just yesterday and he reports that she had nasal discharge that is usually white and thick she has not been able to start treatment for sleep apnea.  He lists her current outpatient medication, diagnosed with chronic sphenoid sinusitis, turbinate hypertrophy and obstructive sleep apnea and discussed the previous CT sinus findings.  He recommended [facility] to me that this may not improve nasal patency at all.  For this reason he also recommended a turbinate reduction.  He wanted to initiate CPAP therapy prior to surgery.  She will  get in contact with him once we have started CPAP therapy and I will order auto titration now from my end.  Unfortunately I have been very great difficulties connecting with the patient.  My nurse had done an intake exam over the phone she endorsed the Epworth Sleepiness Scale at 1 point, fatigue severity score was not requested, I will place an order for auto CPAP based on Dr. Ricker conversation with the patient.  Revisit in 2 months with NP     REVIEW OF SYSTEMS: Out of a complete 14 system review of symptoms, the patient complains only of the following symptoms, back pain and all other reviewed systems are negative.  ESS: 0/24, 1/24  ALLERGIES: Allergies  Allergen Reactions   Aspirin Other (See Comments)    Bleeding stomach ulcers   Codeine Nausea And Vomiting   Dexamethasone    Other     Pholcodine causes nausea and vomiting   Prednisone Palpitations    HOME MEDICATIONS: Outpatient Medications Prior to Visit  Medication Sig Dispense Refill   acetaminophen  (TYLENOL ) 325 MG tablet Take 2 tablets (650 mg total) by mouth every 6 (six) hours as needed for mild pain (or Fever >/= 101). 30 tablet 0   ALOE VERA CONCENTRATE PO Take by mouth.     Ascorbic Acid (VITAMIN C PO) Take by mouth daily.     Biotin w/ Vitamins C & E (HAIR/SKIN/NAILS PO) Take 1 tablet by mouth daily.     Cholecalciferol (VITAMIN D-3 PO) Take by mouth daily.     fluticasone (FLONASE) 50 MCG/ACT nasal spray Place 2 sprays into both nostrils daily.   11   Multiple Vitamins-Minerals (PRESERVISION AREDS 2 PO) Take by mouth daily.     olmesartan (BENICAR) 20 MG tablet Take 10 mg by mouth daily.      omeprazole  (PRILOSEC) 40 MG capsule Take 40 mg by mouth daily.     polyethylene glycol (MIRALAX / GLYCOLAX) packet Take 17 g by mouth daily.     Psyllium (METAMUCIL FIBER PO) Take by mouth.     No facility-administered medications prior to visit.    PAST MEDICAL HISTORY: Past Medical History:  Diagnosis Date   Allergy     Anemia    Blood transfusion without reported diagnosis    Cataract    Cervical pain (neck)    Headache    HLD (hyperlipidemia)    Hypertension    PONV (postoperative nausea and vomiting)    Sinus disease    Varicose veins     PAST SURGICAL HISTORY: Past Surgical History:  Procedure Laterality Date   ABDOMINAL HYSTERECTOMY     partial   BIOPSY  02/14/2018   Procedure: BIOPSY;  Surgeon: Wilhelmenia Aloha Raddle., MD;  Location: [facility];  Service: Gastroenterology;;   BREAST REDUCTION SURGERY  2001   COLONOSCOPY     ESOPHAGOGASTRODUODENOSCOPY N/A 02/14/2018   Procedure: ESOPHAGOGASTRODUODENOSCOPY (EGD);  Surgeon: Wilhelmenia Aloha Raddle., MD;  Location: [facility];  Service: Gastroenterology;  Laterality: N/A;   REDUCTION MAMMAPLASTY Bilateral 2002   ROTATOR CUFF REPAIR Right    UPPER GASTROINTESTINAL ENDOSCOPY      FAMILY HISTORY: Family History  Problem Relation Age of Onset   Cancer Mother    Diabetes Mother    Hypertension Mother    Esophageal cancer Mother    Cancer Father    Hyperlipidemia Father    Hypertension Father    Varicose Veins Father    Colon polyps Brother    Breast cancer Neg Hx    Colon cancer Neg Hx    Inflammatory bowel disease Neg Hx    Liver disease Neg Hx    Pancreatic cancer Neg Hx    Rectal cancer Neg Hx    Stomach cancer Neg Hx     SOCIAL HISTORY: Social History   Socioeconomic History   Marital status: Married    Spouse name: Not on file   Number of children: Not on file   Years of education: Not on file   Highest education level: Not on file  Occupational History   Not on file  Tobacco Use   Smoking status: Never   Smokeless tobacco: Never  Vaping Use   Vaping status:  Never Used  Substance and Sexual Activity   Alcohol  use: Yes    Comment: occasional    Drug use: Never   Sexual activity: Not on file  Other Topics Concern   Not on file  Social History Narrative   Not on file   Social Drivers of Health    Financial Resource Strain: Not on file  Food Insecurity: Not on file  Transportation Needs: Not on file  Physical Activity: Not on file  Stress: Not on file  Social Connections: Not on file  Intimate Partner Violence: Not on file      PHYSICAL EXAM  There were no vitals filed for this visit.     There is no height or weight on file to calculate BMI.  Generalized: Well developed, in no acute distress  Cardiology: normal rate and rhythm, no murmur noted Respiratory: clear to auscultation bilaterally  Neurological examination  Mentation: Alert oriented to time, place, history taking. Follows all commands speech and language fluent Cranial nerve II-XII: Pupils were equal round reactive to light. Extraocular movements were full, visual field were full  Motor: The motor testing reveals 5 over 5 strength of all 4 extremities. Good symmetric motor tone is noted throughout.  Gait and station: Gait is normal.   DIAGNOSTIC DATA (LABS, IMAGING, TESTING) - I reviewed patient records, labs, notes, testing and imaging myself where available.      No data to display           Lab Results  Component Value Date   WBC 6.9 01/13/2019   HGB 13.5 01/13/2019   HCT 40.4 01/13/2019   MCV 95.7 01/13/2019   PLT 277.0 01/13/2019      Component Value Date/Time   NA 141 02/14/2018 0550   K 4.0 02/14/2018 0550   CL 109 02/14/2018 0550   CO2 24 02/14/2018 0550   GLUCOSE 105 (H) 02/14/2018 0550   BUN 14 02/14/2018 0550   CREATININE 0.68 10/11/2022 1006   CALCIUM 9.0 10/11/2022 1006   PROT 6.2 (L) 02/13/2018 1306   ALBUMIN 3.8 02/13/2018 1306   AST 25 02/13/2018 1306   ALT 19 02/13/2018 1306   ALKPHOS 56 02/13/2018 1306   BILITOT 0.6 02/13/2018 1306   GFRNONAA >60 10/11/2022 1006   GFRAA >60 02/14/2018 0550   No results found for: CHOL, HDL, LDLCALC, LDLDIRECT, TRIG, CHOLHDL No results found for: YHAJ8R Lab Results  Component Value Date   VITAMINB12 >1500 (H)  04/01/2018   No results found for: TSH   ASSESSMENT AND PLAN 78 y.o. year old female  has a past medical history of Allergy, Anemia, Blood transfusion without reported diagnosis, Cataract, Cervical pain (neck), Headache, HLD (hyperlipidemia), Hypertension, PONV (postoperative nausea and vomiting), Sinus disease, and Varicose veins. here with   No diagnosis found.     Ms. Chinita is doing very well on CPAP therapy.  Compliance report reveals excellent daily and optimal 4 hour compliance. She was encouraged to continue using CPAP nightly and for greater than 4 hours each night.  Healthy lifestyle habits encouraged.  She will follow-up with me in 1 year, sooner if needed.  She verbalizes understanding and agreement with this plan.   No orders of the defined types were placed in this encounter.     No orders of the defined types were placed in this encounter.      Greig Forbes, FNP-C 12/23/2023, 2:50 PM [facility] [address] [place], [place] [address] [phone]

## 2023-12-24 ENCOUNTER — Encounter: Payer: Self-pay | Admitting: Family Medicine

## 2023-12-24 ENCOUNTER — Ambulatory Visit (INDEPENDENT_AMBULATORY_CARE_PROVIDER_SITE_OTHER): Payer: Medicare Other | Admitting: Family Medicine

## 2023-12-24 VITALS — BP 120/74 | HR 65 | Ht 64.5 in | Wt 163.5 lb

## 2023-12-24 DIAGNOSIS — G4733 Obstructive sleep apnea (adult) (pediatric): Secondary | ICD-10-CM

## 2023-12-26 DIAGNOSIS — G4733 Obstructive sleep apnea (adult) (pediatric): Secondary | ICD-10-CM | POA: Diagnosis not present

## 2024-01-12 ENCOUNTER — Other Ambulatory Visit: Payer: Self-pay | Admitting: Internal Medicine

## 2024-01-12 DIAGNOSIS — Z1231 Encounter for screening mammogram for malignant neoplasm of breast: Secondary | ICD-10-CM

## 2024-01-16 ENCOUNTER — Ambulatory Visit

## 2024-01-19 ENCOUNTER — Ambulatory Visit

## 2024-01-29 DIAGNOSIS — Z6827 Body mass index (BMI) 27.0-27.9, adult: Secondary | ICD-10-CM | POA: Diagnosis not present

## 2024-01-29 DIAGNOSIS — M47816 Spondylosis without myelopathy or radiculopathy, lumbar region: Secondary | ICD-10-CM | POA: Diagnosis not present

## 2024-01-30 DIAGNOSIS — N281 Cyst of kidney, acquired: Secondary | ICD-10-CM | POA: Diagnosis not present

## 2024-01-30 DIAGNOSIS — N200 Calculus of kidney: Secondary | ICD-10-CM | POA: Diagnosis not present

## 2024-01-30 DIAGNOSIS — N958 Other specified menopausal and perimenopausal disorders: Secondary | ICD-10-CM | POA: Diagnosis not present

## 2024-02-06 ENCOUNTER — Ambulatory Visit
Admission: RE | Admit: 2024-02-06 | Discharge: 2024-02-06 | Disposition: A | Discharge status: Home / Self Care | Source: Ambulatory Visit | Attending: Internal Medicine | Admitting: Internal Medicine

## 2024-02-06 DIAGNOSIS — Z1231 Encounter for screening mammogram for malignant neoplasm of breast: Secondary | ICD-10-CM

## 2024-03-04 ENCOUNTER — Ambulatory Visit: Attending: Neurosurgery

## 2024-03-04 ENCOUNTER — Other Ambulatory Visit: Payer: Self-pay

## 2024-03-04 DIAGNOSIS — R262 Difficulty in walking, not elsewhere classified: Secondary | ICD-10-CM | POA: Insufficient documentation

## 2024-03-04 DIAGNOSIS — M25651 Stiffness of right hip, not elsewhere classified: Secondary | ICD-10-CM | POA: Diagnosis present

## 2024-03-04 DIAGNOSIS — M5459 Other low back pain: Secondary | ICD-10-CM | POA: Diagnosis present

## 2024-03-04 NOTE — Progress Notes (Signed)
 OUTPATIENT PHYSICAL THERAPY THORACOLUMBAR EVALUATION   Patient Name: Dana Mcgrath MRN: [id number] DOB:May 17, 1945, 78 y.o., female Today's Date: 03/04/2024  END OF SESSION:  PT End of Session - 03/04/24 1316     Visit Number 1    Date for Recertification  04/29/24    PT Start Time 1316    PT Stop Time 1400    PT Time Calculation (min) 44 min    Activity Tolerance Patient tolerated treatment well    Behavior During Therapy WFL for tasks assessed/performed          Past Medical History:  Diagnosis Date   Allergy    Anemia    Blood transfusion without reported diagnosis    Cataract    Cervical pain (neck)    Headache    HLD (hyperlipidemia)    Hypertension    PONV (postoperative nausea and vomiting)    Sinus disease    Varicose veins    Past Surgical History:  Procedure Laterality Date   ABDOMINAL HYSTERECTOMY     partial   BIOPSY  02/14/2018   Procedure: BIOPSY;  Surgeon: Wilhelmenia Aloha Raddle., MD;  Location: [facility];  Service: Gastroenterology;;   BREAST REDUCTION SURGERY  2001   COLONOSCOPY     ESOPHAGOGASTRODUODENOSCOPY N/A 02/14/2018   Procedure: ESOPHAGOGASTRODUODENOSCOPY (EGD);  Surgeon: Wilhelmenia Aloha Raddle., MD;  Location: [facility];  Service: Gastroenterology;  Laterality: N/A;   REDUCTION MAMMAPLASTY Bilateral 2002   ROTATOR CUFF REPAIR Right    UPPER GASTROINTESTINAL ENDOSCOPY     Patient Active Problem List   Diagnosis Date Noted   OSA on CPAP 03/25/2019   Peptic ulcer disease 04/08/2018   Iron deficiency anemia 04/08/2018   Schatzki's ring of distal esophagus 04/08/2018   Hiatal hernia 04/08/2018   Cameron lesion, acute 04/08/2018   Constipation 04/08/2018   Symptomatic anemia 02/13/2018   Melena 02/13/2018   Varicose veins of both lower extremities with complications 11/25/2014    PCP: Shepard Ade, MD  REFERRING PROVIDER: Onetha Kuba, MD  REFERRING DIAG: lumbar facet arthropathy  Rationale for Evaluation and  Treatment: Rehabilitation  THERAPY DIAG:  Other low back pain - Plan: PT plan of care cert/re-cert  Stiffness of right hip, not elsewhere classified - Plan: PT plan of care cert/re-cert  Difficulty in walking, not elsewhere classified - Plan: PT plan of care cert/re-cert  ONSET DATE: chronic, worse since 2020  SUBJECTIVE:                                                                                                                                                                                           SUBJECTIVE STATEMENT:  The last ablation caused my legs to get really weak and my L leg was numb lateral thigh and it took weeks to get over it and I am afraid to have any more ablations.    PERTINENT HISTORY:  Has undergone some ablations for chronic lumbar spine pain,   PAIN:  Are you having pain? Yes: NPRS scale: 0 to 6 Pain location: across lumbar spine, center L 3 and below and radiating outward, and B post hips  Pain description: deep ache Aggravating factors: standing still, walking,transferring sit to stand Relieving factors: sitting is better  PRECAUTIONS: None  RED FLAGS: None   WEIGHT BEARING RESTRICTIONS: No  FALLS:  Has patient fallen in last 6 months? 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  DIAGNOSTIC FINDINGS:  Facet arthropathy per MD referral  PATIENT SURVEYS:  Modified Oswestry Low Back Pain Disability Questionnaire: 26 / 50 = 52.0 %  COGNITION: Overall cognitive status: Within functional limits for tasks assessed     SENSATION: WFL Normal today but reports h/o numbness L lateral thigh  MUSCLE  LENGTH: Hamstrings: Right wnl deg; Left wnl deg Prone knee bend: Right 90 deg; Left 100 deg  POSTURE: R iliac crest elevated, L scapula elevated, concavity R lumbar spine, also forward flexed trunk to 20 degrees in standing  PALPATION: Non tender L gluteals, piriformis, lat hip, quite tender R gluteals, piriformis, also L4/5 paraspinals and multifidus  LUMBAR ROM:   AROM eval  Flexion wfl  Extension 40  Right lateral flexion 50  Left lateral flexion 65  Right rotation   Left rotation    (Blank rows = not tested)  LOWER EXTREMITY ROM:     Passive  Right eval Left eval  Hip flexion    Hip extension    Hip abduction    Hip adduction    Hip internal rotation    Hip external rotation 15 25  Knee flexion    Knee extension    Ankle dorsiflexion    Ankle plantarflexion    Ankle inversion    Ankle eversion     (Blank rows = not tested)  LOWER EXTREMITY MMT:    MMT Right eval Left eval  Hip flexion    Hip extension    Hip abduction    Hip adduction    Hip internal rotation    Hip external rotation    Knee flexion    Knee extension    Ankle dorsiflexion    Ankle plantarflexion    Ankle inversion    Ankle eversion     (Blank rows = not tested)  LUMBAR SPECIAL TESTS:  Straight leg raise test: Negative and FABER test: Negative  FUNCTIONAL TESTS:  Timed up and go (TUG): 22  GAIT: Distance walked: 59' in clinic Assistive device utilized: None Level of assistance: Modified independence Comments: B LE's ER with gait, wider than typical base of support  TREATMENT DATE: 03/04/24: Evaluation, educated pt and husband regarding purpose of dry needling and provided written info regarding indication/ description as well as charges.   Also education regarding efficacy of  walkingin a pool to reduce compression forces lumbar spine  PATIENT  EDUCATION:  Education details: POC, goals Person educated: Patient and Spouse Education method: Explanation, Demonstration, Tactile cues, and Verbal cues Education comprehension: verbalized understanding  HOME EXERCISE PROGRAM: TBD  ASSESSMENT:  CLINICAL IMPRESSION: Patient is a 78 y.o. female who was evaluated today by physical therapy for pain LS region due to facet arthropathy. She has undergone ablation in the last few months and the last time she had residual neural sx of numbness and weakness for several hours, so ablation not an option.today with evaluation she did not present with any specific strength deficits LE's, but does demonstrate altered posture, some R hip ant capsule restriction as well as tenderness throughout R posterior gluteal musculature.  She may benefit from dry needling over her R gluteals, also discussed with pt benefits of stretching B ant hip jts as well as aquatic exercise.  Will plan physical therapy intervention with TPDN next session as well as manual techniques, instruction in stretching for B ant hips.   OBJECTIVE IMPAIRMENTS: decreased activity tolerance, decreased endurance, decreased knowledge of condition, decreased mobility, difficulty walking, decreased ROM, impaired perceived functional ability, postural dysfunction, and pain.   ACTIVITY LIMITATIONS: carrying, lifting, bending, sitting, standing, squatting, sleeping, transfers, bed mobility, locomotion level, and caring for others  PARTICIPATION LIMITATIONS: meal prep, cleaning, laundry, and community activity  PERSONAL FACTORS: Age, Behavior pattern, Education, Fitness, Time since onset of injury/illness/exacerbation, and 1-2 comorbidities: osteopenia, h/o anemia are also affecting patient's functional outcome.   REHAB POTENTIAL: Good  CLINICAL DECISION MAKING: Evolving/moderate complexity  EVALUATION COMPLEXITY: Moderate   GOALS: Goals reviewed with patient? Yes  SHORT TERM GOALS: Target  date: 2 weeks, 03/18/24  I HEP Baseline: Goal status: INITIAL   LONG TERM GOALS: Target date: 8 weeks, 04/29/24  Modified Oswestry Low Back Pain Disability Questionnaire: 26 / 50 = 52.0 % improve to 16/50 or less Baseline:  Goal status: INITIAL  2.  Able to walk 6 min without support, maintaining upright posture Baseline: consistent forward bent posture through hips with standing and gait Goal status: INITIAL  3.  Improve B hip extension ROM , prone knee bend 100 or greater B without lower back pain Baseline:  Goal status: INITIAL   PLAN:  PT FREQUENCY: 1-2x/week  PT DURATION: 8 weeks  PLANNED INTERVENTIONS: 97110-Therapeutic exercises, 97530- Therapeutic activity, [id number]- Neuromuscular re-education, 97535- Self Care, [id number]- Manual therapy, [phone]- Aquatic Therapy, and Patient/Family education.  PLAN FOR NEXT SESSION: pt to attend next week for TPDN R post gluts and lumbar region and will also need inst,stretching B hips to improve extension motion   Sariyah Corcino L Bryann Mcnealy, PT, DPT, OCS 03/04/2024, 4:17 PM

## 2024-03-09 ENCOUNTER — Ambulatory Visit

## 2024-03-23 ENCOUNTER — Other Ambulatory Visit: Payer: Self-pay

## 2024-03-23 ENCOUNTER — Ambulatory Visit: Attending: Neurosurgery

## 2024-03-23 DIAGNOSIS — R262 Difficulty in walking, not elsewhere classified: Secondary | ICD-10-CM | POA: Diagnosis present

## 2024-03-23 DIAGNOSIS — M25651 Stiffness of right hip, not elsewhere classified: Secondary | ICD-10-CM | POA: Insufficient documentation

## 2024-03-23 DIAGNOSIS — M5459 Other low back pain: Secondary | ICD-10-CM | POA: Insufficient documentation

## 2024-03-23 NOTE — Therapy (Signed)
 Expand All Collapse All   OUTPATIENT PHYSICAL THERAPY THORACOLUMBAR EVALUATION     Patient Name: Dana Mcgrath MRN: [id number] DOB:26-Oct-1945, 79 y.o., female Today's Date: 03/04/2024    PT End of Session - 03/23/24 1531     Visit Number 2    Date for Recertification  04/29/24    PT Start Time 1531    PT Stop Time 1615    PT Time Calculation (min) 44 min    Activity Tolerance Patient tolerated treatment well    Behavior During Therapy [facility] for tasks assessed/performed                  Past Medical History:  Diagnosis Date   Allergy     Anemia     Blood transfusion without reported diagnosis     Cataract     Cervical pain (neck)     Headache     HLD (hyperlipidemia)     Hypertension     PONV (postoperative nausea and vomiting)     Sinus disease     Varicose veins               Past Surgical History:  Procedure Laterality Date   ABDOMINAL HYSTERECTOMY        partial   BIOPSY   02/14/2018    Procedure: BIOPSY;  Surgeon: Wilhelmenia Aloha Raddle., MD;  Location: [facility];  Service: Gastroenterology;;   BREAST REDUCTION SURGERY   2001   COLONOSCOPY       ESOPHAGOGASTRODUODENOSCOPY N/A 02/14/2018    Procedure: ESOPHAGOGASTRODUODENOSCOPY (EGD);  Surgeon: Wilhelmenia Aloha Raddle., MD;  Location: [facility];  Service: Gastroenterology;  Laterality: N/A;   REDUCTION MAMMAPLASTY Bilateral 2002   ROTATOR CUFF REPAIR Right     UPPER GASTROINTESTINAL ENDOSCOPY                Patient Active Problem List    Diagnosis Date Noted   OSA on CPAP 03/25/2019   Peptic ulcer disease 04/08/2018   Iron deficiency anemia 04/08/2018   Schatzki's ring of distal esophagus 04/08/2018   Hiatal hernia 04/08/2018   Cameron lesion, acute 04/08/2018   Constipation 04/08/2018   Symptomatic anemia 02/13/2018   Melena 02/13/2018   Varicose veins of both lower extremities with complications 11/25/2014      PCP: Shepard Ade, MD   REFERRING PROVIDER: Onetha Kuba, MD    REFERRING DIAG: lumbar facet arthropathy   Rationale for Evaluation and Treatment: Rehabilitation   THERAPY DIAG:  Other low back pain - Plan: PT plan of care cert/re-cert   Stiffness of right hip, not elsewhere classified - Plan: PT plan of care cert/re-cert   Difficulty in walking, not elsewhere classified - Plan: PT plan of care cert/re-cert   ONSET DATE: chronic, worse since 2020   SUBJECTIVE:  SUBJECTIVE STATEMENT: The last ablation caused my legs to get really weak and my L leg was numb lateral thigh and it took weeks to get over it and I am afraid to have any more ablations.     PERTINENT HISTORY:  Has undergone some ablations for chronic lumbar spine pain,    PAIN:  Are you having pain? Yes: NPRS scale: 0 to 6 Pain location: across lumbar spine, center L 3 and below and radiating outward, and B post hips  Pain description: deep ache Aggravating factors: standing still, walking,transferring sit to stand Relieving factors: sitting is better   PRECAUTIONS: None   RED FLAGS: None      WEIGHT BEARING RESTRICTIONS: No   FALLS:  Has patient fallen in last 6 months? 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   DIAGNOSTIC FINDINGS:  Facet arthropathy per MD referral   PATIENT SURVEYS:  Modified Oswestry Low Back Pain Disability Questionnaire: 26 / 50 = 52.0 %   COGNITION: Overall cognitive status: Within functional limits for  tasks assessed                          SENSATION: WFL Normal today but reports h/o numbness L lateral thigh   MUSCLE LENGTH: Hamstrings: Right wnl deg; Left wnl deg Prone knee bend: Right 90 deg; Left 100 deg   POSTURE: R iliac crest elevated, L scapula elevated, concavity R lumbar spine, also forward flexed trunk to 20 degrees in standing   PALPATION: Non tender L gluteals, piriformis, lat hip, quite tender R gluteals, piriformis, also L4/5 paraspinals and multifidus   LUMBAR ROM:    AROM eval  Flexion wfl  Extension 40  Right lateral flexion 50  Left lateral flexion 65  Right rotation    Left rotation     (Blank rows = not tested)   LOWER EXTREMITY ROM:      Passive  Right eval Left eval  Hip flexion      Hip extension      Hip abduction      Hip adduction      Hip internal rotation      Hip external rotation 15 25  Knee flexion      Knee extension      Ankle dorsiflexion      Ankle plantarflexion      Ankle inversion      Ankle eversion       (Blank rows = not tested)   LOWER EXTREMITY MMT:     MMT Right eval Left eval  Hip flexion      Hip extension      Hip abduction      Hip adduction      Hip internal rotation      Hip external rotation      Knee flexion      Knee extension      Ankle dorsiflexion      Ankle plantarflexion      Ankle inversion      Ankle eversion       (Blank rows = not tested)   LUMBAR SPECIAL  TESTS:  Straight leg raise test: Negative and FABER test: Negative   FUNCTIONAL TESTS:  Timed up and go (TUG): 22   GAIT: Distance walked: 47' in clinic Assistive device utilized: None Level of assistance: Modified independence Comments: B LE's ER with gait, wider than typical base of support   TREATMENT DATE:  03/23/24: TPDN: Trigger Point Dry Needling  Initial Treatment: Pt instructed on Dry Needling rational, procedures, and possible side effects. Pt instructed to expect mild to moderate muscle soreness later in the day  and/or into the next day.  Pt instructed in methods to reduce muscle soreness. Pt instructed to continue prescribed HEP. Patient was educated on signs and symptoms of infection and other risk factors and advised to seek medical attention should they occur.  Patient verbalized understanding of these instructions and education.   Patient Verbal Consent Given: Yes Education Handout Provided: Previously Provided Muscles Treated: B glut medius, B glut minimus, L piriformis Electrical Stimulation Performed: No Treatment Response/Outcome: decreased tissue resistance   Deep tissue massage over B gluteals   Instructed in bridging, seated hip abd with t band, and standing heel raises with t band resistance around ankles    03/04/24: Evaluation, educated pt and husband regarding purpose of dry needling and provided written info regarding indication/ description as well as charges.   Also education regarding efficacy of  walkingin a pool to reduce compression forces lumbar spine                                                                                                                                 PATIENT EDUCATION:  Education details: POC, goals Person educated: Patient and Spouse Education method: Explanation, Demonstration, Tactile cues, and Verbal cues Education comprehension: verbalized understanding   HOME EXERCISE PROGRAM: Access Code: [email or web address] URL: [email or web address] Date: 03/23/2024 Prepared by: Greig [employer]  Exercises - Bridge  - 1 x daily - 7 x weekly - 1 sets - 10 reps - Seated Hip Abduction  - 1 x daily - 7 x weekly - 1 sets - 10 reps - Standing Heel Raise with Band  - 1 x daily - 7 x weekly - 1 sets - 10 reps+ TBD   ASSESSMENT:   CLINICAL IMPRESSION:03/24/23: we performed TPDN today as prescribed by her referring neurologist.  She tolerated well.  Adjuncted treatment session today with therex to engage gluteals, incorporating extension and rotational  movements.  She will evaluate how she responds to today's treatment and then call for further appts.  Eval:  Patient is a 79 y.o. female who was evaluated today by physical therapy for pain LS region due to facet arthropathy. She has undergone ablation in the last few months and the last time she had residual neural sx of numbness and weakness for several hours, so ablation not an option.today with evaluation she did not present with any specific strength deficits LE's, but does demonstrate altered posture, some R hip ant  capsule restriction as well as tenderness throughout R posterior gluteal musculature.  She may benefit from dry needling over her R gluteals, also discussed with pt benefits of stretching B ant hip jts as well as aquatic exercise.  Will plan physical therapy intervention with TPDN next session as well as manual techniques, instruction in stretching for B ant hips.    OBJECTIVE IMPAIRMENTS: decreased activity tolerance, decreased endurance, decreased knowledge of condition, decreased mobility, difficulty walking, decreased ROM, impaired perceived functional ability, postural dysfunction, and pain.    ACTIVITY LIMITATIONS: carrying, lifting, bending, sitting, standing, squatting, sleeping, transfers, bed mobility, locomotion level, and caring for others   PARTICIPATION LIMITATIONS: meal prep, cleaning, laundry, and community activity   PERSONAL FACTORS: Age, Behavior pattern, Education, Fitness, Time since onset of injury/illness/exacerbation, and 1-2 comorbidities: osteopenia, h/o anemia are also affecting patient's functional outcome.    REHAB POTENTIAL: Good   CLINICAL DECISION MAKING: Evolving/moderate complexity   EVALUATION COMPLEXITY: Moderate     GOALS: Goals reviewed with patient? Yes   SHORT TERM GOALS: Target date: 2 weeks, 03/18/24   I HEP Baseline: Goal status: INITIAL     LONG TERM GOALS: Target date: 8 weeks, 04/29/24   Modified Oswestry Low Back Pain  Disability Questionnaire: 26 / 50 = 52.0 % improve to 16/50 or less Baseline:  Goal status: INITIAL   2.  Able to walk 6 min without support, maintaining upright posture Baseline: consistent forward bent posture through hips with standing and gait Goal status: INITIAL   3.  Improve B hip extension ROM , prone knee bend 100 or greater B without lower back pain Baseline:  Goal status: INITIAL     PLAN:   PT FREQUENCY: 1-2x/week   PT DURATION: 8 weeks   PLANNED INTERVENTIONS: 97110-Therapeutic exercises, 97530- Therapeutic activity, [id number]- Neuromuscular re-education, 97535- Self Care, 02859- Manual therapy, [phone]- Aquatic Therapy, and Patient/Family education.   PLAN FOR NEXT SESSION: pt to attend again in one to 2 weeks for TPDN R post gluts and lumbar region and will also need inst,stretching B hips to improve extension motion     Lasean Rahming L Norvel Wenker, PT, DPT, OCS 03/04/2024, 4:17 PM

## 2024-04-14 ENCOUNTER — Ambulatory Visit: Admitting: Podiatry

## 2024-04-14 DIAGNOSIS — M2042 Other hammer toe(s) (acquired), left foot: Secondary | ICD-10-CM

## 2024-04-14 DIAGNOSIS — L84 Corns and callosities: Secondary | ICD-10-CM | POA: Diagnosis not present

## 2024-04-14 DIAGNOSIS — M2041 Other hammer toe(s) (acquired), right foot: Secondary | ICD-10-CM

## 2024-04-14 NOTE — Progress Notes (Signed)
 Subjective:   Patient ID: Dana Mcgrath, female   DOB: 79 y.o.   MRN: [id number]   HPI Patient presents stating she has painful corns and calluses on both feet and is developing some discomfort also on top of both feet and is complaining about hammertoe deformity bilateral   ROS      Objective:  Physical Exam  Neurovascular status intact with elevated 2nd and 3rd digits bilateral moderate discomfort in the dorsum of the foot with mild edema and chronic corn condition plantar aspect both feet     Assessment:  Digital deformities present with hammertoe deformity and chronic corn callus formation     Plan:  H&P done debrided lesions bilateral no iatrogenic bleeding with sharp sterile instrumentation discussed digital deformities do not recommend current surgery at 1 point we may need to deal with this and then also may need to deal with tendinitis dorsally if it were to get worse

## 2025-01-04 ENCOUNTER — Ambulatory Visit: Admitting: Family Medicine
